# Patient Record
Sex: Male | Born: 1950 | Race: White | Hispanic: No | Marital: Married | Smoking: Never smoker
Health system: Southern US, Community
[De-identification: ages and names within clinical notes are randomized; demographics above are authoritative.]

## PROBLEM LIST (undated history)

## (undated) DIAGNOSIS — I10 Essential (primary) hypertension: Secondary | ICD-10-CM

## (undated) DIAGNOSIS — G4733 Obstructive sleep apnea (adult) (pediatric): Secondary | ICD-10-CM

## (undated) DIAGNOSIS — I2510 Atherosclerotic heart disease of native coronary artery without angina pectoris: Secondary | ICD-10-CM

## (undated) DIAGNOSIS — E785 Hyperlipidemia, unspecified: Secondary | ICD-10-CM

## (undated) DIAGNOSIS — M179 Osteoarthritis of knee, unspecified: Secondary | ICD-10-CM

## (undated) DIAGNOSIS — M1990 Unspecified osteoarthritis, unspecified site: Secondary | ICD-10-CM

## (undated) DIAGNOSIS — M1710 Unilateral primary osteoarthritis, unspecified knee: Secondary | ICD-10-CM

## (undated) HISTORY — DX: Essential (primary) hypertension: I10

## (undated) HISTORY — PX: BACK SURGERY: SHX140

## (undated) HISTORY — PX: HEMORRHOID SURGERY: SHX153

## (undated) HISTORY — DX: Unspecified osteoarthritis, unspecified site: M19.90

## (undated) HISTORY — DX: Obstructive sleep apnea (adult) (pediatric): G47.33

## (undated) HISTORY — DX: Atherosclerotic heart disease of native coronary artery without angina pectoris: I25.10

## (undated) HISTORY — DX: Hyperlipidemia, unspecified: E78.5

---

## 2006-09-15 ENCOUNTER — Emergency Department (HOSPITAL_COMMUNITY): Admission: EM | Admit: 2006-09-15 | Discharge: 2006-09-15 | Payer: Self-pay | Admitting: Emergency Medicine

## 2011-01-16 ENCOUNTER — Other Ambulatory Visit (HOSPITAL_COMMUNITY): Payer: Self-pay | Admitting: Chiropractic Medicine

## 2011-01-16 DIAGNOSIS — R52 Pain, unspecified: Secondary | ICD-10-CM

## 2011-01-20 ENCOUNTER — Ambulatory Visit (HOSPITAL_COMMUNITY)
Admission: RE | Admit: 2011-01-20 | Discharge: 2011-01-20 | Disposition: A | Discharge status: Home / Self Care | Payer: 59 | Source: Ambulatory Visit | Attending: Chiropractic Medicine | Admitting: Chiropractic Medicine

## 2011-01-20 DIAGNOSIS — IMO0002 Reserved for concepts with insufficient information to code with codable children: Secondary | ICD-10-CM | POA: Insufficient documentation

## 2011-01-20 DIAGNOSIS — R52 Pain, unspecified: Secondary | ICD-10-CM

## 2011-01-20 DIAGNOSIS — M719 Bursopathy, unspecified: Secondary | ICD-10-CM | POA: Insufficient documentation

## 2011-01-20 DIAGNOSIS — M67919 Unspecified disorder of synovium and tendon, unspecified shoulder: Secondary | ICD-10-CM | POA: Insufficient documentation

## 2011-01-20 DIAGNOSIS — M75100 Unspecified rotator cuff tear or rupture of unspecified shoulder, not specified as traumatic: Secondary | ICD-10-CM | POA: Insufficient documentation

## 2011-01-20 DIAGNOSIS — M25519 Pain in unspecified shoulder: Secondary | ICD-10-CM | POA: Insufficient documentation

## 2011-02-13 ENCOUNTER — Encounter: Payer: Self-pay | Admitting: Orthopedic Surgery

## 2011-02-13 ENCOUNTER — Ambulatory Visit (INDEPENDENT_AMBULATORY_CARE_PROVIDER_SITE_OTHER): Payer: 59 | Admitting: Orthopedic Surgery

## 2011-02-13 VITALS — BP 128/80 | Ht 74.0 in | Wt 226.0 lb

## 2011-02-13 DIAGNOSIS — M75101 Unspecified rotator cuff tear or rupture of right shoulder, not specified as traumatic: Secondary | ICD-10-CM

## 2011-02-13 DIAGNOSIS — M719 Bursopathy, unspecified: Secondary | ICD-10-CM

## 2011-02-13 MED ORDER — HYDROCODONE-ACETAMINOPHEN 5-325 MG PO TABS: 1.0000 | ORAL_TABLET | Freq: Four times a day (QID) | ORAL | Status: AC | PRN

## 2011-02-13 MED ORDER — NABUMETONE 500 MG PO TABS: 500.0000 mg | ORAL_TABLET | Freq: Two times a day (BID) | ORAL | Status: DC

## 2011-02-13 NOTE — Progress Notes (Signed)
Patient ID: Corey Bruce, male   DOB: [birth year], 60 y.o.   MRN: [id number] Referral from Dr. Ladona Ridgel  Chief complaint is pain RIGHT shoulder  The patient was followed by Dr. Ladona Ridgel for pain in his RIGHT shoulder and he was treated with several manipulations.  He complains of catching and severe pain with forward elevation which wakes him up at night.  However he has not been treated with physical therapy, injection or medication.  His pain started gradually 5 months ago his pain level is 10 out of 10 is worse at night and at rest he gets relief.  Review of systems negative for all systems  History as noted above and has been reviewed  Physical Exam(12) GENERAL: normal development   CDV: pulses are normal   Skin: normal  Lymph: nodes were not palpable/normal  Psychiatric: awake, alert and oriented  Neuro: normal sensation  MSK Cervical spine shows no restrictions in motion no increased muscle tone and no deformity.   1RIGHT shoulder forward elevation pain and 110 passive range of motion 150 2 Rotator cuff strength is normal 3 External rotation is normal 4 Shoulder is stable 5 No tenderness at the a.c. joint 6 Positive impingement sign  Assessment: Rotator cuff syndrome    Plan: Subacromial injection, physical therapy, nabumetone oral medication and Norco for pain.  If no improvement after 8 weeks reschedule for reevaluation.  MRI shows no cuff tear.  Tendinopathy age expected.  Bursitis seen him for signs of bursitis and also a cyst is seen behind the coracoid

## 2011-02-13 NOTE — Patient Instructions (Signed)
You have received a steroid shot. 15% of patients experience increased pain at the injection site with in the next 24 hours. This is best treated with ice and tylenol extra strength 2 tabs every 8 hours. If you are still having pain please call the office.  Start OT Therapy

## 2011-03-11 ENCOUNTER — Ambulatory Visit (HOSPITAL_COMMUNITY)
Admission: RE | Admit: 2011-03-11 | Discharge: 2011-03-11 | Disposition: A | Discharge status: Home / Self Care | Payer: 59 | Source: Ambulatory Visit | Attending: Orthopedic Surgery | Admitting: Orthopedic Surgery

## 2011-03-11 ENCOUNTER — Ambulatory Visit (HOSPITAL_COMMUNITY): Payer: 59 | Admitting: Specialist

## 2011-03-11 DIAGNOSIS — M7541 Impingement syndrome of right shoulder: Secondary | ICD-10-CM | POA: Insufficient documentation

## 2011-03-11 DIAGNOSIS — M6281 Muscle weakness (generalized): Secondary | ICD-10-CM | POA: Insufficient documentation

## 2011-03-11 DIAGNOSIS — M25619 Stiffness of unspecified shoulder, not elsewhere classified: Secondary | ICD-10-CM | POA: Insufficient documentation

## 2011-03-11 DIAGNOSIS — M25519 Pain in unspecified shoulder: Secondary | ICD-10-CM | POA: Insufficient documentation

## 2011-03-11 DIAGNOSIS — IMO0001 Reserved for inherently not codable concepts without codable children: Secondary | ICD-10-CM | POA: Insufficient documentation

## 2011-03-11 NOTE — Progress Notes (Signed)
Occupational Therapy Evaluation  Patient Details  Name: Corey Bruce MRN: [id number] Date of Birth: 01/14/1951  Today's Date: 03/11/2011 Time: 1355-1430 Time Calculation (min): 35 min OT Evaluation 1355-1415 20' Manual Therapy 1415-1430 15' Visit#: 1  of 8   Re-eval: 04/08/11  Assessment Diagnosis: Right Shoulder Impingement Syndrome Next MD Visit: unscheduled Prior Therapy: none  Past Medical History: No past medical history on file. Past Surgical History:  Past Surgical History  Procedure Date  . Hemorrhoid surgery   . Back surgery     Subjective Symptoms/Limitations Symptoms: S:  I have been having pain in my right shoulder on and off for about 7 months.  It hurts the worst at night.  My goal is to get rid of the pain. Limitations: Pertinent History:  Corey Bruce has been experiencing pain in his shoulder for 6-7 months.  He has been recieivng chiropractic care which has alleviated his pain somewhat.  He recieved a cortisone injection in his right shoulder in mid December.  He has not had any relief from his pain.  He has been referred to occupational therapy for evaluation and treatment. Pain Assessment Currently in Pain?: Yes Pain Score:   7 Pain Location: Shoulder Pain Orientation: Right Pain Type: Acute pain  Assessment Additional Assessments RUE AROM (degrees) RUE Overall AROM Comments: Assessed in seated.  ER and IR assessed with shoulder abducted Right Shoulder Flexion  0-170: 178 Degrees Right Shoulder ABduction 0-140: 145 Degrees Right Shoulder Internal Rotation  0-70: 75 Degrees Right Shoulder External Rotation  0-90: 85 Degrees RUE Strength Right Shoulder Flexion: 5/5 Right Shoulder ABduction: 5/5 Right Shoulder Internal Rotation: 5/5 Right Shoulder External Rotation: 5/5 Palpation Palpation: Minimal fascial restrictions noted in his right shoulder region.  Exercise/Treatments Supine Horizontal ABduction: PROM;10 reps External Rotation: PROM;10  reps Internal Rotation: PROM;10 reps Flexion: PROM;10 reps ABduction: PROM;10 reps    Manual Therapy Manual Therapy: Myofascial release Myofascial Release: MFR and manual massage and stretching to right shoulder region to decrease pain and increase mobility in pain free range.  Occupational Therapy Assessment and Plan OT Assessment and Plan Clinical Impression Statement: A:  Patient presents with intermittent increased pain and decreased scapular stability in his right shoulder, causing decreased comfort with overhead activities and when sleeping. Rehab Potential: Excellent OT Frequency: Min 2X/week OT Duration: 4 weeks OT Treatment/Interventions: Therapeutic exercise;Manual therapy;Modalities;Patient/family education OT Plan: P:  Skilled OT intervention to decrease pain and restrictions and increase mobility in his right shoulder.  Treatment Plan:  Manual massage and MFR to right scapular region.  Therapeutic Exercises:  Seated  shoulder AROM, thumb tacks, prot/ret//elev/dep, cybex row, w arms, x to v , tband for scapular stability, ball on wall, ube in reverse.  Progress to prone strengthening.  Add [place] if patient not having any relief from MFR after 2-3 sessions.   Goals Short Term Goals Time to Complete Short Term Goals: 2 weeks Short Term Goal 1: Patient will be educated on a HEP. Short Term Goal 2: Patient will increase AROM abduction and internal rotation by 10 degrees for increased ability to reach overhead. Short Term Goal 3: Patient will increase scapular stability from good to good+. Short Term Goal 4: Patient will decrease pain in his right shoulder from 9/10 to 5/10 when completing overhead activities. Short Term Goal 5: Patient will decrease fasical restrictions from minimal to trace in his right shoulder. Long Term Goals Time to Complete Long Term Goals: 4 weeks Long Term Goal 1: Patient will return to prior level  of I with all B/IADLs and leisure activities. Long Term Goal  2: Patient will increase AROM abduction and internal rotation to WNL for increased ability to complete activities overhead. Long Term Goal 3: Patient will increase scapular stability to normal. Long Term Goal 4: Patient will decrease fascial restrictions to 0 in his right shoulder region. Long Term Goal 5: Patient will decrease pain to 0/10 in his right shoulder when reaching overhead.  Problem List Patient Active Problem List  Diagnoses  . Impingement syndrome of right shoulder  . Pain in joint, shoulder region    End of Session Activity Tolerance: Patient tolerated treatment well General Behavior During Session: [facility] for tasks performed Cognition: [facility] for tasks performed OT Plan of Care OT Home Exercise Plan: Educated on shoulder stretches and theraband for scapular stability. Consulted and Agree with Plan of Care: Patient   Shirlean Mylar, OTR/L  03/11/2011, 3:47 PM  Physician Documentation Your signature is required to indicate approval of the treatment plan as stated above.  Please sign and either send electronically or make a copy of this report for your files and return this physician signed original.  Please mark one 1.__approve of plan  2. ___approve of plan with the following conditions.   ______________________________                                                          _____________________ Physician Signature                                                                                                             Date

## 2011-03-13 ENCOUNTER — Ambulatory Visit (HOSPITAL_COMMUNITY)
Admission: RE | Admit: 2011-03-13 | Discharge: 2011-03-13 | Disposition: A | Discharge status: Home / Self Care | Payer: 59 | Source: Ambulatory Visit | Attending: Internal Medicine | Admitting: Internal Medicine

## 2011-03-13 ENCOUNTER — Ambulatory Visit (HOSPITAL_COMMUNITY): Payer: 59 | Admitting: Specialist

## 2011-03-13 DIAGNOSIS — M25519 Pain in unspecified shoulder: Secondary | ICD-10-CM

## 2011-03-13 DIAGNOSIS — M7541 Impingement syndrome of right shoulder: Secondary | ICD-10-CM

## 2011-03-13 NOTE — Progress Notes (Signed)
Occupational Therapy Treatment  Patient Details  Name: Karas Pickerill MRN: [id number] Date of Birth: 1950-11-26  Today's Date: 03/13/2011 Time: 0347-4259 Manual Therapy [phone] 20' Therapeutic Exercise [phone] 18' Time Calculation (min): 38 min  Visit#: 2  of 8   Re-eval: 04/08/11    Subjective Symptoms/Limitations Symptoms: S: My shoulder is ore sore than yesterday. I started doing the exercises and it is more sore today. Repetition: Increases Symptoms Pain Assessment Currently in Pain?: Yes Pain Score:   6 Pain Location: Shoulder Pain Orientation: Right Pain Type: Acute pain Pain Onset: More than a month ago Pain Frequency: Intermittent  Precautions/Restrictions     Exercise/Treatments Supine Horizontal ABduction: PROM;10 reps External Rotation: PROM;10 reps Internal Rotation: PROM;10 reps Flexion: PROM;10 reps ABduction: PROM;10 reps Seated Elevation: AROM;15 reps;Theraband (red) Extension: Strengthening;15 reps;Theraband (red) Retraction: Strengthening;15 reps;Theraband (red) Row: Strengthening;15 reps;Theraband (red) Protraction: Strengthening;15 reps;Theraband (red) Horizontal ABduction: Strengthening;15 reps;Theraband (red) Theraband Level (Shoulder Horizontal ABduction):  (red) External Rotation: Strengthening;15 reps;Theraband (red) Internal Rotation: Strengthening;15 reps;Theraband (red) Flexion: Strengthening;15 reps;Theraband (red) Abduction: Strengthening;15 reps;Theraband (red) Prone    Sidelying   Standing   Pulleys   Therapy Ball   ROM / Strengthening / Radio producer Therapy Manual Therapy: Myofascial release Myofascial Release: MFR and manual stretch to Right shoulder region to decrease pain and increase mobility in pain free range.  Occupational Therapy Assessment and Plan OT Assessment and Plan Rehab Potential: Excellent OT Frequency: Min  2X/week OT Duration: 4 weeks OT Treatment/Interventions: Therapeutic exercise;Manual therapy;Modalities;Patient/family education OT Plan: P:  Skilled OT intervention to decrease pain and restrictions and increase mobility in his right shoulder.  Treatment Plan:  Manual massage and MFR to right scapular region.  Therapeutic Exercises:  Seated  shoulder AROM, thumb tacks, prot/ret//elev/dep, cybex row, w arms, x to v , tband for scapular stability, ball on wall, ube in reverse.  Progress to prone strengthening.  Add [place] if patient not having any relief from MFR after 2-3 sessions.   Goals    Problem List Patient Active Problem List  Diagnoses  . Impingement syndrome of right shoulder  . Pain in joint, shoulder region    End of Session Activity Tolerance: Patient tolerated treatment well General Behavior During Session: [facility] for tasks performed Cognition: [facility] for tasks performed OT Plan of Care OT Home Exercise Plan: Educated on shoulder stretches and theraband for scapular stability. Consulted and Agree with Plan of Care: Patient   Lisa Roca OTR/L 03/13/2011, 10:32 AM

## 2011-03-17 ENCOUNTER — Ambulatory Visit (HOSPITAL_COMMUNITY): Payer: 59 | Admitting: Specialist

## 2011-03-18 ENCOUNTER — Ambulatory Visit (HOSPITAL_COMMUNITY)
Admission: RE | Admit: 2011-03-18 | Discharge: 2011-03-18 | Disposition: A | Discharge status: Home / Self Care | Payer: 59 | Source: Ambulatory Visit | Attending: Internal Medicine | Admitting: Internal Medicine

## 2011-03-18 DIAGNOSIS — M7541 Impingement syndrome of right shoulder: Secondary | ICD-10-CM

## 2011-03-18 DIAGNOSIS — M25519 Pain in unspecified shoulder: Secondary | ICD-10-CM

## 2011-03-18 NOTE — Progress Notes (Signed)
Occupational Therapy Treatment  Patient Details  Name: Corey Bruce MRN: [id number] Date of Birth: 01-27-1951  Today's Date: 03/18/2011 Time: 4098-1191 Time Calculation (min): 37 min Manual therapy [phone] 9' Therapeutic Exercise [phone] 28' Visit#: 3  of 8   Re-eval: 04/08/11   Subjective Symptoms/Limitations Symptoms: S: My apin is a little better. Repetition: Decreases Symptoms Pain Assessment Currently in Pain?: Yes Pain Score:   2 Pain Location: Shoulder Pain Orientation: Right Pain Type: Acute pain Pain Onset: In the past 7 days Pain Frequency: Intermittent  Precautions/Restrictions     Exercise/Treatments Supine Horizontal ABduction: PROM;10 reps External Rotation: PROM;10 reps Internal Rotation: PROM;10 reps Flexion: PROM;10 reps ABduction: PROM;10 reps Seated Elevation: AROM;15 reps;Theraband Theraband Level (Shoulder Elevation): Level 2 (Red) Extension: Strengthening;Theraband;20 reps Theraband Level (Shoulder Extension): Level 2 (Red) Retraction: Strengthening;20 reps;Theraband Theraband Level (Shoulder Retraction): Level 2 (Red) Row: Strengthening;20 reps;Theraband Theraband Level (Shoulder Row): Level 2 (Red) Protraction: Strengthening;20 reps;Theraband Theraband Level (Shoulder Protraction): Level 2 (Red) Horizontal ABduction: Strengthening;20 reps;Theraband Theraband Level (Shoulder Horizontal ABduction): Level 2 (Red) External Rotation: Strengthening;20 reps;Theraband Theraband Level (Shoulder External Rotation): Level 2 (Red) Internal Rotation: Strengthening;20 reps;Theraband Theraband Level (Shoulder Internal Rotation): Level 2 (Red) Flexion: Strengthening;20 reps;Theraband (flexed elbow) Theraband Level (Shoulder Flexion): Level 2 (Red) Abduction: Strengthening;20 reps;Theraband (flexed elbow)    ROM / Strengthening / Isometric Strengthening UBE (Upper Arm Bike): 3'x3' level 2 Thumb Tacks:  (1 min) Wall Pushups: 15 reps (do not push  all the way up and keep pain free) Ball on Wall:  (1 min with elbow close to side.)      Manual Therapy Manual Therapy: Joint mobilization Joint Mobilization: Light joint mob. in si=upine for increase A/PROM   Occupational Therapy Assessment and Plan OT Assessment and Plan Clinical Impression Statement: A: Patient has decreased pain in shouldr today with increase functional mobility. Still unable to do exercises with arm abducted and flex away from body without pain. Rehab Potential: Excellent OT Frequency: Min 2X/week OT Duration: 4 weeks OT Treatment/Interventions: Therapeutic exercise;Manual therapy;Modalities;Patient/family education OT Plan: P: Continue POC Added strengthening 20 reps with tband, UBE in reveerse, balon wall, and pro/re/elev/ depr of scapula   Home Exercise Program PT Goal: Perform Home Exercise Program - Progress: Progressing toward goal Short Term Goals Short Term Goal 1 Progress: Met Short Term Goal 2 Progress: Progressing toward goal Short Term Goal 3 Progress: Progressing toward goal Short Term Goal 4 Progress: Progressing toward goal Short Term Goal 5 Progress: Met  Problem List Patient Active Problem List  Diagnoses  . Impingement syndrome of right shoulder  . Pain in joint, shoulder region    End of Session Activity Tolerance: Patient tolerated treatment well General Behavior During Session: [facility] for tasks performed Cognition: [facility] for tasks performed OT Plan of Care OT Home Exercise Plan: Educated on shoulder stretches and theraband for scapular stability. Consulted and Agree with Plan of Care: Patient   Lisa Roca OTR/L 03/18/2011, 8:57 AM

## 2011-03-19 ENCOUNTER — Ambulatory Visit (HOSPITAL_COMMUNITY): Payer: 59 | Admitting: Occupational Therapy

## 2011-03-20 ENCOUNTER — Ambulatory Visit (HOSPITAL_COMMUNITY)
Admission: RE | Admit: 2011-03-20 | Discharge: 2011-03-20 | Disposition: A | Discharge status: Home / Self Care | Payer: 59 | Source: Ambulatory Visit | Attending: Internal Medicine | Admitting: Internal Medicine

## 2011-03-20 DIAGNOSIS — M7541 Impingement syndrome of right shoulder: Secondary | ICD-10-CM

## 2011-03-20 DIAGNOSIS — M25519 Pain in unspecified shoulder: Secondary | ICD-10-CM

## 2011-03-20 NOTE — Progress Notes (Signed)
Occupational Therapy Treatment  Patient Details  Name: Corey Bruce MRN: [id number] Date of Birth: 1950/07/16  Today's Date: 03/20/2011 Time: 4098-1191 Manual Therapy [phone] 30' Therapeutic Exercise [phone] 10'  Time Calculation (min): 40 min  Visit#: 4  of 8   Re-eval: 04/08/11    Subjective Symptoms/Limitations Symptoms: S: "I think I may have messed up. I tried to help my buddy move something and now it hurts more." Repetition: Increases Symptoms Pain Assessment Currently in Pain?: Yes Pain Score:   5 Pain Location: Shoulder Pain Orientation: Right Pain Type: Acute pain Pain Onset: In the past 7 days Pain Frequency: Intermittent Multiple Pain Sites: No  Precautions/Restrictions     Exercise/Treatments Supine Horizontal ABduction: PROM;10 reps External Rotation: PROM;10 reps Internal Rotation: PROM;10 reps Flexion: PROM;10 reps ABduction: PROM;10 reps Seated Elevation: AROM;15 reps Extension: AROM;15 reps Retraction: AROM;15 reps Row: AROM;15 reps   Therapy Ball Flexion: 20 reps ABduction: 20 reps Right/Left: 5 reps ROM / Strengthening / Isometric Strengthening UBE (Upper Arm Bike):  (2 min backward level 1)     Manual Therapy Manual Therapy: Myofascial release Myofascial Release: MFR and manual stretch to decrease pain and increase mobility in pain free range  Occupational Therapy Assessment and Plan OT Assessment and Plan Clinical Impression Statement: A: Program modifed due to patient coming in with increased pain . MFR relieved pain to 0/10 and pateint able to do the AAROM and light use of shoulder without pain. Rehab Potential: Excellent OT Frequency: Min 2X/week OT Duration: 4 weeks OT Treatment/Interventions: Self-care/ADL training;Therapeutic exercise;DME and/or AE instruction;Manual therapy;Modalities;Therapeutic activities;Patient/family education OT Plan: P: Cintinue POC able to do reverse UBE and scapula ex. but unable to do tband .    Goals Home Exercise Program PT Goal: Perform Home Exercise Program - Progress: Progressing toward goal Short Term Goals Short Term Goal 1 Progress: Met Short Term Goal 2 Progress: Met Short Term Goal 3 Progress: Progressing toward goal Short Term Goal 4 Progress: Met Short Term Goal 5 Progress: Met Long Term Goals Long Term Goal 1 Progress: Progressing toward goal Long Term Goal 2 Progress: Progressing toward goal Long Term Goal 3 Progress: Progressing toward goal Long Term Goal 4 Progress: Progressing toward goal Long Term Goal 5 Progress: Progressing toward goal  Problem List Patient Active Problem List  Diagnoses  . Impingement syndrome of right shoulder  . Pain in joint, shoulder region    End of Session Activity Tolerance: Patient tolerated treatment well General Behavior During Session: [facility] for tasks performed Cognition: [facility] for tasks performed OT Plan of Care OT Home Exercise Plan: Educated patient on need to not be lifting with the arm right now greater tahn about 5 lbs. and to obey pain. Consulted and Agree with Plan of Care: Patient   Lisa Roca OTR/L 03/20/2011, 11:22 AM

## 2011-03-21 ENCOUNTER — Ambulatory Visit (HOSPITAL_COMMUNITY): Payer: 59 | Admitting: Occupational Therapy

## 2011-03-24 ENCOUNTER — Ambulatory Visit (HOSPITAL_COMMUNITY): Payer: 59 | Admitting: Occupational Therapy

## 2011-03-25 ENCOUNTER — Ambulatory Visit (HOSPITAL_COMMUNITY)
Admission: RE | Admit: 2011-03-25 | Discharge: 2011-03-25 | Disposition: A | Discharge status: Home / Self Care | Payer: 59 | Source: Ambulatory Visit | Attending: Internal Medicine | Admitting: Internal Medicine

## 2011-03-25 DIAGNOSIS — M25519 Pain in unspecified shoulder: Secondary | ICD-10-CM

## 2011-03-25 DIAGNOSIS — M7541 Impingement syndrome of right shoulder: Secondary | ICD-10-CM

## 2011-03-25 NOTE — Progress Notes (Signed)
Occupational Therapy Treatment  Patient Details  Name: Corey Bruce MRN: [id number] Date of Birth: Jul 12, 1950  Today's Date: 03/25/2011 Time: 7829-5621 Time Calculation (min): 40 min Manual Therapy [phone] 10' Therapeutic Exercise [phone] 30' Visit#: 5  of 8   Re-eval: 04/08/11    Subjective Symptoms/Limitations Symptoms: S:" I needed to put shelving together. I can't stop doing stuff." Patient needs to be active and productive Repetition: Decreases Symptoms Pain Assessment Currently in Pain?: Yes Pain Score:   4 Pain Location: Shoulder Pain Orientation: Left Pain Type: Acute pain Pain Onset: In the past 7 days Pain Frequency: Intermittent  Precautions/Restrictions     Exercise/Treatments Supine Horizontal ABduction: PROM;10 reps External Rotation: PROM;10 reps Internal Rotation: PROM;10 reps Flexion: PROM;10 reps ABduction: PROM;10 reps Seated Elevation: AROM;15 reps Extension: Strengthening;10 reps;Theraband Theraband Level (Shoulder Extension): Level 4 (Blue) Retraction: Strengthening;10 reps;Theraband Theraband Level (Shoulder Retraction): Level 4 (Blue) Row: Strengthening;10 reps;Theraband Theraband Level (Shoulder Row): Level 3 (Green) Protraction: Strengthening;10 reps;Theraband Theraband Level (Shoulder Protraction): Level 4 (Blue) Horizontal ABduction: Strengthening;10 reps;Theraband Theraband Level (Shoulder Horizontal ABduction): Level 4 (Blue) External Rotation: Strengthening;20 reps;Theraband Theraband Level (Shoulder External Rotation): Level 4 (Blue) Internal Rotation: Strengthening;20 reps;Theraband Theraband Level (Shoulder Internal Rotation): Level 4 (Blue) Flexion: Strengthening;20 reps;Theraband Theraband Level (Shoulder Flexion): Level 4 (Blue) Abduction: Strengthening;20 reps;Theraband Theraband Level (Shoulder ABduction): Level 4 (Blue) Therapy Ball Flexion: 20 reps ABduction: 20 reps Right/Left: 5 reps ROM / Strengthening /  Isometric Strengthening UBE (Upper Arm Bike):  (3'and 3 level 3)      Manual Therapy Joint Mobilization: Light PROM to shoulder in supine with good result and no pain.  Occupational Therapy Assessment and Plan     Goals Short Term Goals Time to Complete Short Term Goals: 2 weeks Short Term Goal 1: Patient will be educated on a HEP. Short Term Goal 1 Progress: Met Short Term Goal 2: Patient will increase AROM abduction and internal rotation by 10 degrees for increased ability to reach overhead. Short Term Goal 2 Progress: Met Short Term Goal 3: Patient will increase scapular stability from good to good+. Short Term Goal 3 Progress: Met Short Term Goal 4: Patient will decrease pain in his right shoulder from 9/10 to 5/10 when completing overhead activities. Short Term Goal 4 Progress: Met Short Term Goal 5: Patient will decrease fasical restrictions from minimal to trace in his right shoulder. Short Term Goal 5 Progress: Met Long Term Goals Time to Complete Long Term Goals: 4 weeks Long Term Goal 1: Patient will return to prior level of I with all B/IADLs and leisure activities. Long Term Goal 1 Progress: Progressing toward goal Long Term Goal 2: Patient will increase AROM abduction and internal rotation to WNL for increased ability to complete activities overhead. Long Term Goal 2 Progress: Progressing toward goal Long Term Goal 3: Patient will increase scapular stability to normal. Long Term Goal 3 Progress: Progressing toward goal Long Term Goal 4: Patient will decrease fascial restrictions to 0 in his right shoulder region. Long Term Goal 4 Progress: Progressing toward goal Long Term Goal 5: Patient will decrease pain to 0/10 in his right shoulder when reaching overhead. Long Term Goal 5 Progress: Progressing toward goal  Problem List Patient Active Problem List  Diagnoses  . Impingement syndrome of right shoulder  . Pain in joint, shoulder region        Lisa Roca OTR/L 03/25/2011, 8:40 AM

## 2011-03-26 ENCOUNTER — Ambulatory Visit (HOSPITAL_COMMUNITY): Payer: 59 | Admitting: Occupational Therapy

## 2011-03-27 ENCOUNTER — Ambulatory Visit (HOSPITAL_COMMUNITY): Payer: 59 | Admitting: Occupational Therapy

## 2011-03-28 ENCOUNTER — Ambulatory Visit (HOSPITAL_COMMUNITY): Payer: 59 | Admitting: Occupational Therapy

## 2011-03-31 ENCOUNTER — Ambulatory Visit (HOSPITAL_COMMUNITY): Payer: 59 | Admitting: Specialist

## 2011-03-31 ENCOUNTER — Ambulatory Visit (HOSPITAL_COMMUNITY): Payer: 59 | Admitting: Occupational Therapy

## 2011-04-01 ENCOUNTER — Ambulatory Visit (HOSPITAL_COMMUNITY): Payer: 59 | Admitting: Occupational Therapy

## 2011-04-01 ENCOUNTER — Telehealth (HOSPITAL_COMMUNITY): Payer: Self-pay

## 2011-04-02 ENCOUNTER — Ambulatory Visit (HOSPITAL_COMMUNITY): Payer: 59 | Admitting: Occupational Therapy

## 2011-04-02 ENCOUNTER — Ambulatory Visit (HOSPITAL_COMMUNITY): Payer: 59 | Admitting: Specialist

## 2011-04-03 ENCOUNTER — Ambulatory Visit (HOSPITAL_COMMUNITY): Payer: 59 | Admitting: Occupational Therapy

## 2011-04-04 ENCOUNTER — Ambulatory Visit (HOSPITAL_COMMUNITY): Payer: 59 | Admitting: Occupational Therapy

## 2011-04-07 ENCOUNTER — Ambulatory Visit (HOSPITAL_COMMUNITY): Payer: 59 | Admitting: Occupational Therapy

## 2011-04-09 ENCOUNTER — Ambulatory Visit (HOSPITAL_COMMUNITY): Payer: 59 | Admitting: Occupational Therapy

## 2011-04-11 ENCOUNTER — Ambulatory Visit (HOSPITAL_COMMUNITY): Payer: 59 | Admitting: Occupational Therapy

## 2012-01-19 ENCOUNTER — Telehealth: Payer: Self-pay

## 2012-01-19 NOTE — Telephone Encounter (Signed)
Pt is scheduled for OV with Tana Coast, PA on 01/21/2012 @ 1:30 AM. For hemorrhoid bleed prior to TCS.

## 2012-01-21 ENCOUNTER — Other Ambulatory Visit: Payer: Self-pay | Admitting: Internal Medicine

## 2012-01-21 ENCOUNTER — Ambulatory Visit (INDEPENDENT_AMBULATORY_CARE_PROVIDER_SITE_OTHER): Payer: 59 | Admitting: Gastroenterology

## 2012-01-21 ENCOUNTER — Encounter: Payer: Self-pay | Admitting: Gastroenterology

## 2012-01-21 VITALS — BP 127/84 | HR 83 | Temp 98.6°F | Ht 73.0 in | Wt 218.2 lb

## 2012-01-21 DIAGNOSIS — K625 Hemorrhage of anus and rectum: Secondary | ICD-10-CM

## 2012-01-21 DIAGNOSIS — Z8719 Personal history of other diseases of the digestive system: Secondary | ICD-10-CM

## 2012-01-21 DIAGNOSIS — Z87898 Personal history of other specified conditions: Secondary | ICD-10-CM

## 2012-01-21 DIAGNOSIS — Z1211 Encounter for screening for malignant neoplasm of colon: Secondary | ICD-10-CM | POA: Insufficient documentation

## 2012-01-21 NOTE — Progress Notes (Signed)
Faxed to PCP

## 2012-01-21 NOTE — Assessment & Plan Note (Signed)
Patient presents to schedule colonoscopy. He has h/o hemorrhoids and rectal bleeding. No recent rectal bleeding however. Colonoscopy in near future.  I have discussed the risks, alternatives, benefits with regards to but not limited to the risk of reaction to medication, bleeding, infection, perforation and the patient is agreeable to proceed. Written consent to be obtained.

## 2012-01-21 NOTE — Progress Notes (Signed)
Primary Care Physician:  HALL,ZACK, MD  Primary Gastroenterologist:  Michael Rourk, MD   Chief Complaint  Patient presents with  . Hemorrhoids    HPI:  Corey Bruce is a 61 y.o. male here to schedule colonoscopy. He has h/o rectal bleeding which last for about one week with heavy lifting, prolonged standing. Symptoms resolved. He thought it might be due to hemorrhoids. He had hemorrhoidectomy 25 years ago. Last colonoscopy about 11 years ago, no polyps. No FH of colon cancer. No constipation, diarrhea. No weight loss. No heartburn, vomiting, dysphagia. Remote etoh abuse, but none in 26 years.    Current Outpatient Prescriptions  Medication Sig Dispense Refill  . Ascorbic Acid (VITAMIN C) 1000 MG tablet Take 1,000 mg by mouth daily.      . glucosamine-chondroitin 500-400 MG tablet Take 1 tablet by mouth 1 day or 1 dose.      . Multiple Vitamin (MULTIVITAMIN) tablet Take 1 tablet by mouth daily.      . olmesartan (BENICAR) 20 MG tablet Take 20 mg by mouth daily.        Allergies as of 01/21/2012 - Review Complete 01/21/2012  Allergen Reaction Noted  . Erythromycin Nausea Only 01/21/2012    Past Medical History  Diagnosis Date  . HTN (hypertension)     Past Surgical History  Procedure Date  . Hemorrhoid surgery   . Back surgery     lower    Family History  Problem Relation Age of Onset  . Diabetes    . Colon cancer Neg Hx     History   Social History  . Marital Status: Married    Spouse Name: N/A    Number of Children: 5  . Years of Education: college   Occupational History  . retired   .     Social History Main Topics  . Smoking status: Never Smoker   . Smokeless tobacco: Not on file     Comment: Smoked only about a year as teenager  . Alcohol Use: No     Comment: remote etoh abuse over 26 years ago  . Drug Use: No  . Sexually Active: Not on file   Other Topics Concern  . Not on file   Social History Narrative  . No narrative on file       ROS:  General: Negative for anorexia, weight loss, fever, chills, fatigue, weakness. Eyes: Negative for vision changes.  ENT: Negative for hoarseness, difficulty swallowing , nasal congestion. CV: Negative for chest pain, angina, palpitations, dyspnea on exertion, peripheral edema.  Respiratory: Negative for dyspnea at rest, dyspnea on exertion, cough, sputum, wheezing.  GI: See history of present illness. GU:  Negative for dysuria, hematuria, urinary incontinence, urinary frequency, nocturnal urination.  MS: Negative for joint pain, low back pain.  Derm: Negative for rash or itching.  Neuro: Negative for weakness, abnormal sensation, seizure, frequent headaches, memory loss, confusion.  Psych: Negative for anxiety, depression, suicidal ideation, hallucinations.  Endo: Negative for unusual weight change.  Heme: Negative for bruising or bleeding. Allergy: Negative for rash or hives.    Physical Examination:  BP 127/84  Pulse 83  Temp 98.6 F (37 C) (Tympanic)  Ht 6' 1" (1.854 m)  Wt 218 lb 3.2 oz (98.975 kg)  BMI 28.79 kg/m2   General: Well-nourished, well-developed in no acute distress.  Head: Normocephalic, atraumatic.   Eyes: Conjunctiva pink, no icterus. Mouth: Oropharyngeal mucosa moist and pink , no lesions erythema or exudate. Neck: Supple without   thyromegaly, masses, or lymphadenopathy.  Lungs: Clear to auscultation bilaterally.  Heart: Regular rate and rhythm, no murmurs rubs or gallops.  Abdomen: Bowel sounds are normal, nontender, nondistended, no hepatosplenomegaly or masses, no abdominal bruits or hernia , no rebound or guarding.   Rectal: deferred Extremities: No lower extremity edema. No clubbing or deformities.  Neuro: Alert and oriented x 4 , grossly normal neurologically.  Skin: Warm and dry, no rash or jaundice.   Psych: Alert and cooperative, normal mood and affect.   

## 2012-01-21 NOTE — Patient Instructions (Signed)
We have scheduled you for a colonoscopy with Dr. Rourk. Please see separate instructions. 

## 2012-01-28 ENCOUNTER — Encounter (HOSPITAL_COMMUNITY): Payer: Self-pay | Admitting: Pharmacy Technician

## 2012-02-09 ENCOUNTER — Ambulatory Visit (HOSPITAL_COMMUNITY)
Admission: RE | Admit: 2012-02-09 | Discharge: 2012-02-09 | Disposition: A | Discharge status: Home / Self Care | Payer: 59 | Source: Ambulatory Visit | Attending: Internal Medicine | Admitting: Internal Medicine

## 2012-02-09 ENCOUNTER — Encounter (HOSPITAL_COMMUNITY)
Admission: RE | Disposition: A | Discharge status: Home / Self Care | Payer: Self-pay | Source: Ambulatory Visit | Attending: Internal Medicine

## 2012-02-09 ENCOUNTER — Encounter (HOSPITAL_COMMUNITY): Payer: Self-pay

## 2012-02-09 DIAGNOSIS — K625 Hemorrhage of anus and rectum: Secondary | ICD-10-CM

## 2012-02-09 DIAGNOSIS — K921 Melena: Secondary | ICD-10-CM | POA: Insufficient documentation

## 2012-02-09 DIAGNOSIS — K644 Residual hemorrhoidal skin tags: Secondary | ICD-10-CM | POA: Insufficient documentation

## 2012-02-09 DIAGNOSIS — I10 Essential (primary) hypertension: Secondary | ICD-10-CM | POA: Insufficient documentation

## 2012-02-09 DIAGNOSIS — D128 Benign neoplasm of rectum: Secondary | ICD-10-CM | POA: Insufficient documentation

## 2012-02-09 DIAGNOSIS — K621 Rectal polyp: Secondary | ICD-10-CM

## 2012-02-09 DIAGNOSIS — K620 Anal polyp: Secondary | ICD-10-CM

## 2012-02-09 DIAGNOSIS — D129 Benign neoplasm of anus and anal canal: Secondary | ICD-10-CM | POA: Insufficient documentation

## 2012-02-09 DIAGNOSIS — Z8719 Personal history of other diseases of the digestive system: Secondary | ICD-10-CM

## 2012-02-09 HISTORY — PX: COLONOSCOPY: SHX5424

## 2012-02-09 SURGERY — COLONOSCOPY
Anesthesia: Moderate Sedation

## 2012-02-09 MED ORDER — SODIUM CHLORIDE 0.45 % IV SOLN
INTRAVENOUS | Status: DC
  Administered 2012-02-09: 13:00:00 via INTRAVENOUS

## 2012-02-09 MED ORDER — STERILE WATER FOR IRRIGATION IR SOLN
Status: DC | PRN
  Administered 2012-02-09: 13:00:00

## 2012-02-09 MED ORDER — MIDAZOLAM HCL 5 MG/5ML IJ SOLN
INTRAMUSCULAR | Status: AC
  Filled 2012-02-09: qty 10

## 2012-02-09 MED ORDER — MEPERIDINE HCL 100 MG/ML IJ SOLN
INTRAMUSCULAR | Status: AC
  Filled 2012-02-09: qty 1

## 2012-02-09 MED ORDER — MEPERIDINE HCL 100 MG/ML IJ SOLN
INTRAMUSCULAR | Status: DC | PRN
  Administered 2012-02-09: 25 mg via INTRAVENOUS
  Administered 2012-02-09: 50 mg via INTRAVENOUS

## 2012-02-09 MED ORDER — MIDAZOLAM HCL 5 MG/5ML IJ SOLN
INTRAMUSCULAR | Status: DC | PRN
  Administered 2012-02-09 (×2): 2 mg via INTRAVENOUS

## 2012-02-09 NOTE — H&P (View-Only) (Signed)
Primary Care Physician:  Dwana Melena, MD  Primary Gastroenterologist:  Roetta Sessions, MD   Chief Complaint  Patient presents with  . Hemorrhoids    HPI:  Corey Bruce is a 61 y.o. male here to schedule colonoscopy. He has h/o rectal bleeding which last for about one week with heavy lifting, prolonged standing. Symptoms resolved. He thought it might be due to hemorrhoids. He had hemorrhoidectomy 25 years ago. Last colonoscopy about 11 years ago, no polyps. No FH of colon cancer. No constipation, diarrhea. No weight loss. No heartburn, vomiting, dysphagia. Remote etoh abuse, but none in 26 years.    Current Outpatient Prescriptions  Medication Sig Dispense Refill  . Ascorbic Acid (VITAMIN C) 1000 MG tablet Take 1,000 mg by mouth daily.      Marland Kitchen glucosamine-chondroitin 500-400 MG tablet Take 1 tablet by mouth 1 day or 1 dose.      . Multiple Vitamin (MULTIVITAMIN) tablet Take 1 tablet by mouth daily.      Marland Kitchen olmesartan (BENICAR) 20 MG tablet Take 20 mg by mouth daily.        Allergies as of 01/21/2012 - Review Complete 01/21/2012  Allergen Reaction Noted  . Erythromycin Nausea Only 01/21/2012    Past Medical History  Diagnosis Date  . HTN (hypertension)     Past Surgical History  Procedure Date  . Hemorrhoid surgery   . Back surgery     lower    Family History  Problem Relation Age of Onset  . Diabetes    . Colon cancer Neg Hx     History   Social History  . Marital Status: Married    Spouse Name: N/A    Number of Children: 5  . Years of Education: college   Occupational History  . retired   .     Social History Main Topics  . Smoking status: Never Smoker   . Smokeless tobacco: Not on file     Comment: Smoked only about a year as teenager  . Alcohol Use: No     Comment: remote etoh abuse over 26 years ago  . Drug Use: No  . Sexually Active: Not on file   Other Topics Concern  . Not on file   Social History Narrative  . No narrative on file       ROS:  General: Negative for anorexia, weight loss, fever, chills, fatigue, weakness. Eyes: Negative for vision changes.  ENT: Negative for hoarseness, difficulty swallowing , nasal congestion. CV: Negative for chest pain, angina, palpitations, dyspnea on exertion, peripheral edema.  Respiratory: Negative for dyspnea at rest, dyspnea on exertion, cough, sputum, wheezing.  GI: See history of present illness. GU:  Negative for dysuria, hematuria, urinary incontinence, urinary frequency, nocturnal urination.  MS: Negative for joint pain, low back pain.  Derm: Negative for rash or itching.  Neuro: Negative for weakness, abnormal sensation, seizure, frequent headaches, memory loss, confusion.  Psych: Negative for anxiety, depression, suicidal ideation, hallucinations.  Endo: Negative for unusual weight change.  Heme: Negative for bruising or bleeding. Allergy: Negative for rash or hives.    Physical Examination:  BP 127/84  Pulse 83  Temp 98.6 F (37 C) (Tympanic)  Ht 6\' 1"  (1.854 m)  Wt 218 lb 3.2 oz (98.975 kg)  BMI 28.79 kg/m2   General: Well-nourished, well-developed in no acute distress.  Head: Normocephalic, atraumatic.   Eyes: Conjunctiva pink, no icterus. Mouth: Oropharyngeal mucosa moist and pink , no lesions erythema or exudate. Neck: Supple without  thyromegaly, masses, or lymphadenopathy.  Lungs: Clear to auscultation bilaterally.  Heart: Regular rate and rhythm, no murmurs rubs or gallops.  Abdomen: Bowel sounds are normal, nontender, nondistended, no hepatosplenomegaly or masses, no abdominal bruits or hernia , no rebound or guarding.   Rectal: deferred Extremities: No lower extremity edema. No clubbing or deformities.  Neuro: Alert and oriented x 4 , grossly normal neurologically.  Skin: Warm and dry, no rash or jaundice.   Psych: Alert and cooperative, normal mood and affect.

## 2012-02-09 NOTE — Interval H&P Note (Signed)
History and Physical Interval Note:  02/09/2012 12:53 PM  Corey Bruce  has presented today for surgery, with the diagnosis of Rectal Bleed, hx of hemorrhoids  The various methods of treatment have been discussed with the patient and family. After consideration of risks, benefits and other options for treatment, the patient has consented to  Procedure(s) (LRB) with comments: COLONOSCOPY (N/A) - 9:30 had to reschd due to ercp case called patient advised of new time 12:30 for 1:30 case patient stated okay/kr  as a surgical intervention .  The patient's history has been reviewed, patient examined, no change in status, stable for surgery.  I have reviewed the patient's chart and labs.  Questions were answered to the patient's satisfaction.     Eula Listen  As above. Colonoscopy per plan.The risks, benefits, limitations, alternatives and imponderables have been reviewed with the patient. Questions have been answered. All parties are agreeable.

## 2012-02-09 NOTE — Op Note (Signed)
[facility] [address] [place], [address]   COLONOSCOPY PROCEDURE REPORT  PATIENT: Corey Bruce, Corey Bruce  MR#:         [id number] BIRTHDATE: 06/28/1950 , 61  yrs. old GENDER: Male ENDOSCOPIST: R.  Roetta Sessions, MD FACP FACG REFERRED BY:  Catalina Pizza, M.D. PROCEDURE DATE:  02/09/2012 PROCEDURE:     Colonoscopy with biopsy  INDICATIONS: recent self limiting episode of hematochezia; last colonoscopy over 10 years ago  INFORMED CONSENT:  The risks, benefits, alternatives and imponderables including but not limited to bleeding, perforation as well as the possibility of a missed lesion have been reviewed.  The potential for biopsy, lesion removal, etc. have also been discussed.  Questions have been answered.  All parties agreeable. Please see the history and physical in the medical record for more information.  MEDICATIONS: Versed 4 mg IV and Demerol 75 mg IV in divided doses.  DESCRIPTION OF PROCEDURE:  After a digital rectal exam was performed, the EC-[id number] ([id number])  colonoscope was advanced from the anus through the rectum and colon to the area of the cecum, ileocecal valve and appendiceal orifice.  The cecum was deeply intubated.  These structures were well-seen and photographed for the record.  From the level of the cecum and ileocecal valve, the scope was slowly and cautiously withdrawn.  The mucosal surfaces were carefully surveyed utilizing scope tip deflection to facilitate fold flattening as needed.  The scope was pulled down into the rectum where a thorough examination including retroflexion was performed.    FINDINGS:  Adequate preparation. 2 external hemorrhoid tags and one anal papilla; there were varices 1) diminutive polyp just inside the anal verge a 2 cm; otherwise, the remainder of rectal mucosa appeared normal. Normal-appearing colonic mucosa.  THERAPEUTIC / DIAGNOSTIC MANEUVERS PERFORMED:  The above-mentioned polyp was cold  biopsied/removed.  COMPLICATIONS: None  CECAL WITHDRAWAL TIME:  10 minutes  IMPRESSION:  External hemorrhoids-likely source of hematochezia/anal papilla. Single rectal polyp-removed as described above. Normal colon.  RECOMMENDATIONS: Followup on pathology. Course of Anusol suppositories.   _______________________________ eSigned:  R. Roetta Sessions, MD FACP [facility] 02/09/2012 1:30 PM   CC:

## 2012-02-11 ENCOUNTER — Encounter (HOSPITAL_COMMUNITY): Payer: Self-pay | Admitting: Internal Medicine

## 2012-02-12 ENCOUNTER — Encounter: Payer: Self-pay | Admitting: Internal Medicine

## 2012-02-12 ENCOUNTER — Encounter: Payer: Self-pay | Admitting: *Deleted

## 2015-01-17 ENCOUNTER — Encounter: Payer: Self-pay | Admitting: Internal Medicine

## 2015-10-05 DIAGNOSIS — K2900 Acute gastritis without bleeding: Secondary | ICD-10-CM | POA: Diagnosis not present

## 2015-10-05 DIAGNOSIS — R42 Dizziness and giddiness: Secondary | ICD-10-CM | POA: Diagnosis not present

## 2015-10-05 DIAGNOSIS — K2970 Gastritis, unspecified, without bleeding: Secondary | ICD-10-CM | POA: Diagnosis not present

## 2015-10-10 DIAGNOSIS — R110 Nausea: Secondary | ICD-10-CM | POA: Diagnosis not present

## 2015-10-10 DIAGNOSIS — R030 Elevated blood-pressure reading, without diagnosis of hypertension: Secondary | ICD-10-CM | POA: Diagnosis not present

## 2015-10-10 DIAGNOSIS — R42 Dizziness and giddiness: Secondary | ICD-10-CM | POA: Diagnosis not present

## 2015-11-08 ENCOUNTER — Other Ambulatory Visit: Payer: Self-pay | Admitting: Orthopedic Surgery

## 2015-11-08 DIAGNOSIS — M1712 Unilateral primary osteoarthritis, left knee: Secondary | ICD-10-CM | POA: Diagnosis not present

## 2015-11-08 DIAGNOSIS — M25561 Pain in right knee: Secondary | ICD-10-CM

## 2015-11-16 ENCOUNTER — Other Ambulatory Visit: Payer: Self-pay | Admitting: Orthopedic Surgery

## 2015-11-16 DIAGNOSIS — M25562 Pain in left knee: Secondary | ICD-10-CM

## 2015-11-19 ENCOUNTER — Ambulatory Visit
Admission: RE | Admit: 2015-11-19 | Discharge: 2015-11-19 | Disposition: A | Discharge status: Home / Self Care | Payer: Medicare Other | Source: Ambulatory Visit | Attending: Orthopedic Surgery | Admitting: Orthopedic Surgery

## 2015-11-19 DIAGNOSIS — M25562 Pain in left knee: Secondary | ICD-10-CM

## 2015-11-20 DIAGNOSIS — S46011A Strain of muscle(s) and tendon(s) of the rotator cuff of right shoulder, initial encounter: Secondary | ICD-10-CM | POA: Diagnosis not present

## 2015-11-20 DIAGNOSIS — M1712 Unilateral primary osteoarthritis, left knee: Secondary | ICD-10-CM | POA: Diagnosis not present

## 2015-11-23 ENCOUNTER — Encounter (HOSPITAL_BASED_OUTPATIENT_CLINIC_OR_DEPARTMENT_OTHER)
Admission: RE | Admit: 2015-11-23 | Discharge: 2015-11-23 | Disposition: A | Discharge status: Home / Self Care | Payer: Medicare Other | Source: Ambulatory Visit | Attending: Orthopedic Surgery | Admitting: Orthopedic Surgery

## 2015-11-23 ENCOUNTER — Encounter (HOSPITAL_BASED_OUTPATIENT_CLINIC_OR_DEPARTMENT_OTHER): Payer: Self-pay | Admitting: *Deleted

## 2015-11-23 DIAGNOSIS — I10 Essential (primary) hypertension: Secondary | ICD-10-CM | POA: Insufficient documentation

## 2015-11-23 DIAGNOSIS — Z01818 Encounter for other preprocedural examination: Secondary | ICD-10-CM | POA: Diagnosis not present

## 2015-11-23 DIAGNOSIS — M1712 Unilateral primary osteoarthritis, left knee: Secondary | ICD-10-CM | POA: Diagnosis not present

## 2015-11-23 DIAGNOSIS — Z79899 Other long term (current) drug therapy: Secondary | ICD-10-CM | POA: Diagnosis not present

## 2015-11-23 DIAGNOSIS — M179 Osteoarthritis of knee, unspecified: Secondary | ICD-10-CM | POA: Diagnosis present

## 2015-11-23 DIAGNOSIS — M1710 Unilateral primary osteoarthritis, unspecified knee: Secondary | ICD-10-CM | POA: Diagnosis present

## 2015-11-23 LAB — BASIC METABOLIC PANEL
ANION GAP: 10 (ref 5–15)
BUN: 16 mg/dL (ref 6–20)
CALCIUM: 9.9 mg/dL (ref 8.9–10.3)
CO2: 24 mmol/L (ref 22–32)
Chloride: 106 mmol/L (ref 101–111)
Creatinine, Ser: 0.84 mg/dL (ref 0.61–1.24)
GFR calc Af Amer: >60 mL/min (ref >60)
GFR calc non Af Amer: >60 mL/min (ref >60)
GLUCOSE: 96 mg/dL (ref 65–99)
Potassium: 3.9 mmol/L (ref 3.5–5.1)
Sodium: 140 mmol/L (ref 135–145)

## 2015-11-23 LAB — SURGICAL PCR SCREEN
MRSA, PCR: NEGATIVE
Staphylococcus aureus: NEGATIVE

## 2015-11-23 NOTE — H&P (Signed)
UNI KNEE ADMISSION H&P  Patient is being admitted for left total knee arthroplasty.  Subjective:  Chief Complaint:left knee pain.  HPI: Corey Bruce, 65 y.o. male, has a history of pain and functional disability in the left knee due to arthritis and has failed non-surgical conservative treatments for greater than 12 weeks to includeNSAID's and/or analgesics, corticosteriod injections, viscosupplementation injections, flexibility and strengthening excercises, supervised PT with diminished ADL's post treatment, weight reduction as appropriate and activity modification.  Onset of symptoms was gradual, starting 10 years ago with gradually worsening course since that time. The patient noted no past surgery on the left knee(s).  Patient currently rates pain in the left knee(s) at 10 out of 10 with activity. Patient has night pain, worsening of pain with activity and weight bearing, pain that interferes with activities of daily living, crepitus and joint swelling.  Patient has evidence of subchondral sclerosis, periarticular osteophytes and joint space narrowing by imaging studies.  There is no active infection.  Patient Active Problem List   Diagnosis Date Noted  . History of rectal bleeding 01/21/2012  . H/O hemorrhoids 01/21/2012  . Encounter for screening colonoscopy for non-high-risk patient 01/21/2012  . Impingement syndrome of right shoulder 03/11/2011  . Pain in joint, shoulder region 03/11/2011   Past Medical History:  Diagnosis Date  . DJD (degenerative joint disease) of knee    left  . HTN (hypertension)     Past Surgical History:  Procedure Laterality Date  . BACK SURGERY     lower  . COLONOSCOPY  02/09/2012   Procedure: COLONOSCOPY;  Surgeon: Daneil Dolin, MD;  Location: [facility];  Service: Endoscopy;  Laterality: N/A;  9:30 had to reschd due to ercp case called patient advised of new time 12:30 for 1:30 case patient stated okay/kr   . HEMORRHOID SURGERY      No  current facility-administered medications for this encounter.   Current Outpatient Prescriptions:  .  losartan (COZAAR) 50 MG tablet, , Disp: , Rfl:     Allergies  Allergen Reactions  . Erythromycin Nausea Only    ORALLY  CAUSED NAUSEA/ IV BURNED VEIN    Social History  Substance Use Topics  . Smoking status: Never Smoker  . Smokeless tobacco: Never Used     Comment: Smoked only about a year as teenager  . Alcohol use No     Comment: remote etoh abuse over 26 years ago    Family History  Problem Relation Age of Onset  . Diabetes    . Colon cancer Neg Hx      Review of Systems  Constitutional: Negative.   HENT: Negative.   Eyes: Negative.   Respiratory: Negative.   Cardiovascular: Negative.   Gastrointestinal: Negative.   Genitourinary: Negative.   Musculoskeletal: Positive for joint pain.  Skin: Negative.   Neurological: Negative.   Endo/Heme/Allergies: Negative.   Psychiatric/Behavioral: Negative.     Objective:  Physical Exam  Constitutional: He is oriented to person, place, and time. He appears well-developed and well-nourished.  HENT:  Head: Normocephalic and atraumatic.  Mouth/Throat: Oropharynx is clear and moist.  Eyes: EOM are normal. Pupils are equal, round, and reactive to light.  Neck: Neck supple.  Cardiovascular: Normal rate.   Respiratory: Effort normal.  GI: Soft.  Genitourinary:  Genitourinary Comments: Not pertinent to current symptomatology therefore not examined.  Musculoskeletal:  Examination of his left knee reveals pain medially  1+ synovitis. Moderate varus deformity. Full range of motion.  Knee is  stable. Examination of his right shoulder reveals pain with decreased range of motion by 30%. Mild weakness on rotator cuff stressing. No instability. Exam of his left shoulder reveals full range of motion without pain, weakness or instability.  Vascular exam: Pulses are 2+ and symmetric.     Neurological: He is oriented to person, place, and  time.  Skin: Skin is warm and dry.  Psychiatric: He has a normal mood and affect.    Vital signs in last 24 hours: Weight:  [101.2 kg (223 lb)] 101.2 kg (223 lb) (09/22 1237)  Labs:   Estimated body mass index is 29.42 kg/m as calculated from the following:   Height as of this encounter: 6\' 1"  (1.854 m).   Weight as of this encounter: 101.2 kg (223 lb).   Imaging Review Plain radiographs demonstrate severe degenerative joint disease of the left knee(s). The overall alignment issignificant varus. The bone quality appears to be good for age and reported activity level.  Assessment/Plan:  End stage arthritis, left knee  Active Problems:   Primary localized osteoarthritis of left knee   HTN (hypertension)   DJD (degenerative joint disease) of knee   The patient history, physical examination, clinical judgment of the provider and imaging studies are consistent with end stage degenerative joint disease of the left knee(s) and total knee arthroplasty is deemed medically necessary. The treatment options including medical management, injection therapy arthroscopy and arthroplasty were discussed at length. The risks and benefits of total knee arthroplasty were presented and reviewed. The risks due to aseptic loosening, infection, stiffness, patella tracking problems, thromboembolic complications and other imponderables were discussed. The patient acknowledged the explanation, agreed to proceed with the plan and consent was signed. Patient is being admitted for inpatient treatment for surgery, pain control, PT, OT, prophylactic antibiotics, VTE prophylaxis, progressive ambulation and ADL's and discharge planning. The patient is planning to be discharged home with home health services

## 2015-11-26 NOTE — Progress Notes (Signed)
MRSA swabbed preformed and results were neg , no med needed

## 2015-12-02 NOTE — Anesthesia Preprocedure Evaluation (Addendum)
Anesthesia Evaluation  Patient identified by MRN, date of birth, ID band Patient awake    Reviewed: Allergy & Precautions, NPO status , Patient's Chart, lab work & pertinent test results  Airway Mallampati: II  TM Distance: >3 FB Neck ROM: Full    Dental no notable dental hx.    Pulmonary neg pulmonary ROS,    Pulmonary exam normal        Cardiovascular hypertension, Pt. on medications Normal cardiovascular exam     Neuro/Psych negative neurological ROS     GI/Hepatic negative GI ROS, Neg liver ROS,   Endo/Other  negative endocrine ROS  Renal/GU negative Renal ROS     Musculoskeletal  (+) Arthritis ,   Abdominal   Peds  Hematology negative hematology ROS (+)   Anesthesia Other Findings   Reproductive/Obstetrics                            Anesthesia Physical Anesthesia Plan  ASA: II  Anesthesia Plan: General and Regional   Post-op Pain Management:  Regional for Post-op pain   Induction: Intravenous  Airway Management Planned: LMA  Additional Equipment:   Intra-op Plan:   Post-operative Plan: Extubation in OR  Informed Consent: I have reviewed the patients History and Physical, chart, labs and discussed the procedure including the risks, benefits and alternatives for the proposed anesthesia with the patient or authorized representative who has indicated his/her understanding and acceptance.   Dental advisory given  Plan Discussed with: CRNA  Anesthesia Plan Comments:        Anesthesia Quick Evaluation

## 2015-12-03 ENCOUNTER — Ambulatory Visit (HOSPITAL_BASED_OUTPATIENT_CLINIC_OR_DEPARTMENT_OTHER): Payer: Medicare Other | Admitting: Certified Registered"

## 2015-12-03 ENCOUNTER — Encounter (HOSPITAL_BASED_OUTPATIENT_CLINIC_OR_DEPARTMENT_OTHER): Payer: Self-pay | Admitting: *Deleted

## 2015-12-03 ENCOUNTER — Encounter (HOSPITAL_BASED_OUTPATIENT_CLINIC_OR_DEPARTMENT_OTHER)
Admission: RE | Disposition: A | Discharge status: Home / Self Care | Payer: Self-pay | Source: Ambulatory Visit | Attending: Orthopedic Surgery

## 2015-12-03 ENCOUNTER — Ambulatory Visit (HOSPITAL_BASED_OUTPATIENT_CLINIC_OR_DEPARTMENT_OTHER)
Admission: RE | Admit: 2015-12-03 | Discharge: 2015-12-04 | Disposition: A | Discharge status: Home / Self Care | Payer: Medicare Other | Source: Ambulatory Visit | Attending: Orthopedic Surgery | Admitting: Orthopedic Surgery

## 2015-12-03 DIAGNOSIS — M1712 Unilateral primary osteoarthritis, left knee: Secondary | ICD-10-CM | POA: Diagnosis present

## 2015-12-03 DIAGNOSIS — M179 Osteoarthritis of knee, unspecified: Secondary | ICD-10-CM | POA: Diagnosis present

## 2015-12-03 DIAGNOSIS — M25562 Pain in left knee: Secondary | ICD-10-CM | POA: Diagnosis not present

## 2015-12-03 DIAGNOSIS — I10 Essential (primary) hypertension: Secondary | ICD-10-CM | POA: Diagnosis present

## 2015-12-03 DIAGNOSIS — G8918 Other acute postprocedural pain: Secondary | ICD-10-CM | POA: Diagnosis not present

## 2015-12-03 DIAGNOSIS — M1710 Unilateral primary osteoarthritis, unspecified knee: Secondary | ICD-10-CM | POA: Diagnosis present

## 2015-12-03 HISTORY — DX: Osteoarthritis of knee, unspecified: M17.9

## 2015-12-03 HISTORY — DX: Unilateral primary osteoarthritis, unspecified knee: M17.10

## 2015-12-03 HISTORY — PX: PARTIAL KNEE ARTHROPLASTY: SHX2174

## 2015-12-03 SURGERY — ARTHROPLASTY, KNEE, UNICOMPARTMENTAL
Anesthesia: Regional | Site: Knee | Laterality: Left

## 2015-12-03 MED ORDER — BUPIVACAINE-EPINEPHRINE (PF) 0.25% -1:200000 IJ SOLN
INTRAMUSCULAR | Status: AC
  Filled 2015-12-03: qty 30

## 2015-12-03 MED ORDER — ONDANSETRON HCL 4 MG/2ML IJ SOLN
INTRAMUSCULAR | Status: AC
  Filled 2015-12-03: qty 2

## 2015-12-03 MED ORDER — HYDROMORPHONE HCL 1 MG/ML IJ SOLN
1.0000 mg | INTRAMUSCULAR | Status: AC | PRN
  Administered 2015-12-03 (×4): 0.5 mg via INTRAVENOUS

## 2015-12-03 MED ORDER — SODIUM CHLORIDE 0.9 % IR SOLN
Status: DC | PRN
  Administered 2015-12-03: 2000 mL
  Administered 2015-12-03: 1000 mL

## 2015-12-03 MED ORDER — CHLORHEXIDINE GLUCONATE 4 % EX LIQD: 60.0000 mL | Freq: Once | CUTANEOUS | Status: DC

## 2015-12-03 MED ORDER — DEXAMETHASONE SODIUM PHOSPHATE 4 MG/ML IJ SOLN
INTRAMUSCULAR | Status: DC | PRN
  Administered 2015-12-03: 10 mg via INTRAVENOUS

## 2015-12-03 MED ORDER — SODIUM CHLORIDE 0.9 % IV SOLN
INTRAVENOUS | Status: DC
  Administered 2015-12-03: 14:00:00 via INTRAVENOUS

## 2015-12-03 MED ORDER — HYDROMORPHONE HCL 1 MG/ML IJ SOLN
INTRAMUSCULAR | Status: AC
  Filled 2015-12-03: qty 1

## 2015-12-03 MED ORDER — FENTANYL CITRATE (PF) 100 MCG/2ML IJ SOLN
50.0000 ug | INTRAMUSCULAR | Status: DC | PRN
  Administered 2015-12-03: 50 ug via INTRAVENOUS

## 2015-12-03 MED ORDER — LACTATED RINGERS IV SOLN
INTRAVENOUS | Status: DC
  Administered 2015-12-03: 10 mL/h via INTRAVENOUS
  Administered 2015-12-03 (×2): via INTRAVENOUS

## 2015-12-03 MED ORDER — DEXAMETHASONE SODIUM PHOSPHATE 10 MG/ML IJ SOLN
INTRAMUSCULAR | Status: AC
  Filled 2015-12-03: qty 1

## 2015-12-03 MED ORDER — OXYCODONE HCL 5 MG PO TABS: ORAL_TABLET | ORAL | 0 refills | Status: DC

## 2015-12-03 MED ORDER — GLYCOPYRROLATE 0.2 MG/ML IJ SOLN: 0.2000 mg | Freq: Once | INTRAMUSCULAR | Status: DC | PRN

## 2015-12-03 MED ORDER — PROPOFOL 10 MG/ML IV BOLUS
INTRAVENOUS | Status: AC
  Filled 2015-12-03: qty 20

## 2015-12-03 MED ORDER — POVIDONE-IODINE 7.5 % EX SOLN: Freq: Once | CUTANEOUS | Status: DC

## 2015-12-03 MED ORDER — OXYCODONE HCL 5 MG PO TABS
5.0000 mg | ORAL_TABLET | ORAL | Status: DC | PRN
  Administered 2015-12-03: 10 mg via ORAL
  Administered 2015-12-03 (×2): 5 mg via ORAL
  Administered 2015-12-04 (×2): 10 mg via ORAL
  Filled 2015-12-03: qty 1
  Filled 2015-12-03: qty 2
  Filled 2015-12-03: qty 1
  Filled 2015-12-03 (×2): qty 2

## 2015-12-03 MED ORDER — METOCLOPRAMIDE HCL 5 MG PO TABS: 5.0000 mg | ORAL_TABLET | Freq: Three times a day (TID) | ORAL | Status: DC | PRN

## 2015-12-03 MED ORDER — EPHEDRINE 5 MG/ML INJ
INTRAVENOUS | Status: AC
  Filled 2015-12-03: qty 10

## 2015-12-03 MED ORDER — LACTATED RINGERS IV SOLN: INTRAVENOUS | Status: DC

## 2015-12-03 MED ORDER — SUCCINYLCHOLINE CHLORIDE 200 MG/10ML IV SOSY
PREFILLED_SYRINGE | INTRAVENOUS | Status: AC
  Filled 2015-12-03: qty 10

## 2015-12-03 MED ORDER — MIDAZOLAM HCL 2 MG/2ML IJ SOLN
INTRAMUSCULAR | Status: AC
  Filled 2015-12-03: qty 2

## 2015-12-03 MED ORDER — SCOPOLAMINE 1 MG/3DAYS TD PT72: 1.0000 | MEDICATED_PATCH | Freq: Once | TRANSDERMAL | Status: DC | PRN

## 2015-12-03 MED ORDER — POLYETHYLENE GLYCOL 3350 17 G PO PACK: 17.0000 g | PACK | Freq: Two times a day (BID) | ORAL | Status: DC

## 2015-12-03 MED ORDER — ONDANSETRON HCL 4 MG/2ML IJ SOLN: 4.0000 mg | Freq: Four times a day (QID) | INTRAMUSCULAR | Status: DC | PRN

## 2015-12-03 MED ORDER — LIDOCAINE HCL (CARDIAC) 20 MG/ML IV SOLN
INTRAVENOUS | Status: DC | PRN
  Administered 2015-12-03: 100 mg via INTRAVENOUS

## 2015-12-03 MED ORDER — CEFAZOLIN SODIUM-DEXTROSE 2-4 GM/100ML-% IV SOLN
2.0000 g | INTRAVENOUS | Status: AC
  Administered 2015-12-03: 2 g via INTRAVENOUS

## 2015-12-03 MED ORDER — METOCLOPRAMIDE HCL 5 MG/ML IJ SOLN: 5.0000 mg | Freq: Three times a day (TID) | INTRAMUSCULAR | Status: DC | PRN

## 2015-12-03 MED ORDER — FENTANYL CITRATE (PF) 100 MCG/2ML IJ SOLN
INTRAMUSCULAR | Status: AC
  Filled 2015-12-03: qty 2

## 2015-12-03 MED ORDER — SUFENTANIL CITRATE 50 MCG/ML IV SOLN
INTRAVENOUS | Status: DC | PRN
  Administered 2015-12-03: 5 ug via INTRAVENOUS

## 2015-12-03 MED ORDER — CEFAZOLIN SODIUM-DEXTROSE 2-4 GM/100ML-% IV SOLN
2.0000 g | Freq: Four times a day (QID) | INTRAVENOUS | Status: AC
  Administered 2015-12-03 – 2015-12-04 (×2): 2 g via INTRAVENOUS
  Filled 2015-12-03 (×2): qty 100

## 2015-12-03 MED ORDER — HYDROMORPHONE HCL 1 MG/ML IJ SOLN
0.5000 mg | INTRAMUSCULAR | Status: DC | PRN
  Administered 2015-12-04 (×2): 1 mg via INTRAVENOUS
  Filled 2015-12-03 (×2): qty 1

## 2015-12-03 MED ORDER — PHENYLEPHRINE 40 MCG/ML (10ML) SYRINGE FOR IV PUSH (FOR BLOOD PRESSURE SUPPORT)
PREFILLED_SYRINGE | INTRAVENOUS | Status: AC
  Filled 2015-12-03: qty 10

## 2015-12-03 MED ORDER — BUPIVACAINE-EPINEPHRINE (PF) 0.5% -1:200000 IJ SOLN
INTRAMUSCULAR | Status: DC | PRN
  Administered 2015-12-03: 25 mL via PERINEURAL

## 2015-12-03 MED ORDER — ONDANSETRON HCL 4 MG/2ML IJ SOLN
INTRAMUSCULAR | Status: DC | PRN
  Administered 2015-12-03: 4 mg via INTRAVENOUS

## 2015-12-03 MED ORDER — CEFAZOLIN SODIUM-DEXTROSE 2-4 GM/100ML-% IV SOLN
INTRAVENOUS | Status: AC
  Filled 2015-12-03: qty 100

## 2015-12-03 MED ORDER — FENTANYL CITRATE (PF) 100 MCG/2ML IJ SOLN: 25.0000 ug | INTRAMUSCULAR | Status: DC | PRN

## 2015-12-03 MED ORDER — MIDAZOLAM HCL 2 MG/2ML IJ SOLN
1.0000 mg | INTRAMUSCULAR | Status: DC | PRN
  Administered 2015-12-03: 1 mg via INTRAVENOUS
  Administered 2015-12-03: 2 mg via INTRAVENOUS

## 2015-12-03 MED ORDER — ONDANSETRON HCL 4 MG PO TABS: 4.0000 mg | ORAL_TABLET | Freq: Four times a day (QID) | ORAL | Status: DC | PRN

## 2015-12-03 MED ORDER — LIDOCAINE 2% (20 MG/ML) 5 ML SYRINGE
INTRAMUSCULAR | Status: AC
  Filled 2015-12-03: qty 5

## 2015-12-03 MED ORDER — BUPIVACAINE-EPINEPHRINE 0.25% -1:200000 IJ SOLN
INTRAMUSCULAR | Status: DC | PRN
  Administered 2015-12-03: 20 mL

## 2015-12-03 MED ORDER — PROPOFOL 10 MG/ML IV BOLUS
INTRAVENOUS | Status: DC | PRN
  Administered 2015-12-03: 250 mg via INTRAVENOUS

## 2015-12-03 MED ORDER — SUFENTANIL CITRATE 50 MCG/ML IV SOLN
INTRAVENOUS | Status: AC
  Filled 2015-12-03: qty 1

## 2015-12-03 MED ORDER — DOCUSATE SODIUM 100 MG PO CAPS
100.0000 mg | ORAL_CAPSULE | Freq: Two times a day (BID) | ORAL | Status: DC
  Administered 2015-12-03: 100 mg via ORAL
  Filled 2015-12-03: qty 1

## 2015-12-03 MED ORDER — PROMETHAZINE HCL 25 MG/ML IJ SOLN
6.2500 mg | INTRAMUSCULAR | Status: DC | PRN
Start: 2015-12-03 — End: 2015-12-04

## 2015-12-03 SURGICAL SUPPLY — 70 items
AQUACEL ×3 IMPLANT
BANDAGE ACE 6X5 VEL STRL LF (GAUZE/BANDAGES/DRESSINGS) ×3 IMPLANT
BANDAGE ESMARK 6X9 LF (GAUZE/BANDAGES/DRESSINGS) ×1 IMPLANT
BLADE HEX COATED 2.75 (ELECTRODE) ×3 IMPLANT
BLADE SURG 10 STRL SS (BLADE) ×6 IMPLANT
BLADE SURG 15 STRL LF DISP TIS (BLADE) ×2 IMPLANT
BLADE SURG 15 STRL SS (BLADE) ×4
BNDG ESMARK 6X9 LF (GAUZE/BANDAGES/DRESSINGS) ×3
BOWL SMART MIX CTS (DISPOSABLE) ×3 IMPLANT
CAPT KNEE PARTIAL 2 ×3 IMPLANT
CEMENT HV SMART SET (Cement) ×3 IMPLANT
CLOSURE WOUND 1/2 X4 (GAUZE/BANDAGES/DRESSINGS) ×1
COVER BACK TABLE 60X90IN (DRAPES) ×3 IMPLANT
CUFF TOURNIQUET SINGLE 34IN LL (TOURNIQUET CUFF) ×3 IMPLANT
DRAPE EXTREMITY T 121X128X90 (DRAPE) ×3 IMPLANT
DRAPE IMP U-DRAPE 54X76 (DRAPES) IMPLANT
DRAPE INCISE IOBAN 66X45 STRL (DRAPES) ×3 IMPLANT
DRAPE OEC MINIVIEW 54X84 (DRAPES) ×3 IMPLANT
DRAPE U-SHAPE 47X51 STRL (DRAPES) ×6 IMPLANT
DRSG PAD ABDOMINAL 8X10 ST (GAUZE/BANDAGES/DRESSINGS) IMPLANT
DURAPREP 26ML APPLICATOR (WOUND CARE) ×3 IMPLANT
ELECT REM PT RETURN 9FT ADLT (ELECTROSURGICAL) ×3
ELECTRODE REM PT RTRN 9FT ADLT (ELECTROSURGICAL) ×1 IMPLANT
FACESHIELD WRAPAROUND (MASK) ×3 IMPLANT
GAUZE SPONGE 4X4 12PLY STRL (GAUZE/BANDAGES/DRESSINGS) ×3 IMPLANT
GLOVE BIO SURGEON STRL SZ 6.5 (GLOVE) ×2 IMPLANT
GLOVE BIO SURGEON STRL SZ7 (GLOVE) ×6 IMPLANT
GLOVE BIO SURGEONS STRL SZ 6.5 (GLOVE) ×1
GLOVE BIOGEL PI IND STRL 7.0 (GLOVE) ×4 IMPLANT
GLOVE BIOGEL PI IND STRL 7.5 (GLOVE) ×1 IMPLANT
GLOVE BIOGEL PI INDICATOR 7.0 (GLOVE) ×8
GLOVE BIOGEL PI INDICATOR 7.5 (GLOVE) ×2
GLOVE SS BIOGEL STRL SZ 7.5 (GLOVE) ×1 IMPLANT
GLOVE SUPERSENSE BIOGEL SZ 7.5 (GLOVE) ×2
GOWN STRL REUS W/ TWL LRG LVL3 (GOWN DISPOSABLE) ×1 IMPLANT
GOWN STRL REUS W/ TWL XL LVL3 (GOWN DISPOSABLE) ×2 IMPLANT
GOWN STRL REUS W/TWL LRG LVL3 (GOWN DISPOSABLE) ×2
GOWN STRL REUS W/TWL XL LVL3 (GOWN DISPOSABLE) ×4
HANDPIECE INTERPULSE COAX TIP (DISPOSABLE) ×2
IMMOBILIZER KNEE 22 UNIV (SOFTGOODS) IMPLANT
IMMOBILIZER KNEE 24 THIGH 36 (MISCELLANEOUS) ×1 IMPLANT
IMMOBILIZER KNEE 24 UNIV (MISCELLANEOUS) ×3
MANIFOLD NEPTUNE II (INSTRUMENTS) ×3 IMPLANT
NS IRRIG 1000ML POUR BTL (IV SOLUTION) ×3 IMPLANT
PACK ARTHROSCOPY DSU (CUSTOM PROCEDURE TRAY) ×3 IMPLANT
PACK BASIN DAY SURGERY FS (CUSTOM PROCEDURE TRAY) ×3 IMPLANT
PACK BLADE SAW RECIP 70 3 PT (BLADE) ×3 IMPLANT
PACK UNIVERSAL I (CUSTOM PROCEDURE TRAY) ×3 IMPLANT
PENCIL BUTTON HOLSTER BLD 10FT (ELECTRODE) ×3 IMPLANT
PILLOW KNEE EXTENSION 0 DEG (MISCELLANEOUS) ×3 IMPLANT
SET HNDPC FAN SPRY TIP SCT (DISPOSABLE) ×1 IMPLANT
SLEEVE SCD COMPRESS KNEE MED (MISCELLANEOUS) ×3 IMPLANT
SPONGE LAP 18X18 X RAY DECT (DISPOSABLE) ×6 IMPLANT
STAPLER VISISTAT 35W (STAPLE) IMPLANT
STRIP CLOSURE SKIN 1/2X4 (GAUZE/BANDAGES/DRESSINGS) ×2 IMPLANT
SUCTION FRAZIER HANDLE 10FR (MISCELLANEOUS) ×2
SUCTION TUBE FRAZIER 10FR DISP (MISCELLANEOUS) ×1 IMPLANT
SUT MNCRL AB 3-0 PS2 18 (SUTURE) ×3 IMPLANT
SUT VIC AB 0 CT1 27 (SUTURE) ×2
SUT VIC AB 0 CT1 27XBRD ANBCTR (SUTURE) ×1 IMPLANT
SUT VIC AB 1 CT1 27 (SUTURE) ×2
SUT VIC AB 1 CT1 27XBRD ANBCTR (SUTURE) ×1 IMPLANT
SUT VIC AB 2-0 CT1 27 (SUTURE) ×2
SUT VIC AB 2-0 CT1 TAPERPNT 27 (SUTURE) ×1 IMPLANT
SUT VIC AB 2-0 SH 27 (SUTURE)
SUT VIC AB 2-0 SH 27XBRD (SUTURE) IMPLANT
SYR BULB IRRIGATION 50ML (SYRINGE) ×3 IMPLANT
TOWEL OR 17X24 6PK STRL BLUE (TOWEL DISPOSABLE) ×6 IMPLANT
TOWEL OR NON WOVEN STRL DISP B (DISPOSABLE) ×6 IMPLANT
YANKAUER SUCT BULB TIP NO VENT (SUCTIONS) ×3 IMPLANT

## 2015-12-03 NOTE — Interval H&P Note (Signed)
History and Physical Interval Note:  12/03/2015 9:01 AM  Corey Bruce  has presented today for surgery, with the diagnosis of Left Knee Primary localized OA  The various methods of treatment have been discussed with the patient and family. After consideration of risks, benefits and other options for treatment, the patient has consented to  Procedure(s): LEFT UNICOMPARTMENTAL KNEE (Left) as a surgical intervention .  The patient's history has been reviewed, patient examined, no change in status, stable for surgery.  I have reviewed the patient's chart and labs.  Questions were answered to the patient's satisfaction.     Elsie Saas A

## 2015-12-03 NOTE — Progress Notes (Signed)
Assisted Dr.Richard Guidetti with left, ultrasound guided, adductor canal block. Side rails up, monitors on throughout procedure. See vital signs in flow sheet. Tolerated Procedure well.

## 2015-12-03 NOTE — Transfer of Care (Signed)
Immediate Anesthesia Transfer of Care Note  Patient: Corey Bruce  Procedure(s) Performed: Procedure(s): LEFT UNICOMPARTMENTAL KNEE (Left)  Patient Location: PACU  Anesthesia Type:General  Level of Consciousness: sedated  Airway & Oxygen Therapy: Patient Spontanous Breathing and Patient connected to face mask oxygen  Post-op Assessment: Report given to RN and Post -op Vital signs reviewed and stable  Post vital signs: Reviewed and stable  Last Vitals:  Vitals:   12/03/15 0822 12/03/15 0823  BP: 118/78   Pulse: 62 62  Resp: 17 14  Temp:      Last Pain:  Vitals:   12/03/15 0757  TempSrc: Oral  PainSc: 1       Patients Stated Pain Goal: 0 ([id number])  Complications: No apparent anesthesia complications

## 2015-12-03 NOTE — Anesthesia Procedure Notes (Signed)
Anesthesia Regional Block:  Adductor canal block  Pre-Anesthetic Checklist: ,, timeout performed, Correct Patient, Correct Site, Correct Laterality, Correct Procedure, Correct Position, site marked, Risks and benefits discussed,  Surgical consent,  Pre-op evaluation,  At surgeon's request and post-op pain management  Laterality: Left  Prep: chloraprep       Needles:   Needle Type: Echogenic Needle     Needle Length: 5cm 5 cm Needle Gauge: 22 and 22 G    Additional Needles:  Procedures: ultrasound guided (picture in chart) Adductor canal block Narrative:  Start time: 12/03/2015 8:15 AM End time: 12/03/2015 8:20 AM Injection made incrementally with aspirations every 25 mL.  Performed by: Personally  Anesthesiologist: Reginal Lutes  Additional Notes: Patient tolerated procedure well.

## 2015-12-03 NOTE — Anesthesia Procedure Notes (Signed)
Procedure Name: LMA Insertion Date/Time: 12/03/2015 9:29 AM Performed by: Melynda Ripple D Pre-anesthesia Checklist: Patient identified, Emergency Drugs available, Suction available and Patient being monitored Patient Re-evaluated:Patient Re-evaluated prior to inductionOxygen Delivery Method: Circle system utilized Preoxygenation: Pre-oxygenation with 100% oxygen Intubation Type: IV induction Ventilation: Mask ventilation without difficulty LMA: LMA inserted LMA Size: 5.0 Number of attempts: 1 Airway Equipment and Method: Bite block Placement Confirmation: positive ETCO2 Tube secured with: Tape Dental Injury: Teeth and Oropharynx as per pre-operative assessment

## 2015-12-03 NOTE — Anesthesia Postprocedure Evaluation (Signed)
Anesthesia Post Note  Patient: Corey Bruce  Procedure(s) Performed: Procedure(s) (LRB): LEFT UNICOMPARTMENTAL KNEE (Left)  Patient location during evaluation: PACU Anesthesia Type: General Level of consciousness: awake and alert Pain management: pain level controlled Vital Signs Assessment: post-procedure vital signs reviewed and stable Respiratory status: spontaneous breathing, nonlabored ventilation, respiratory function stable and patient connected to nasal cannula oxygen Cardiovascular status: blood pressure returned to baseline and stable Postop Assessment: no signs of nausea or vomiting Anesthetic complications: no    Last Vitals:  Vitals:   12/03/15 1215 12/03/15 1230  BP: (!) 161/97 (!) 162/98  Pulse: 76   Resp: (!) 25 16  Temp:      Last Pain:  Vitals:   12/03/15 1230  TempSrc:   PainSc: [name]

## 2015-12-04 DIAGNOSIS — I10 Essential (primary) hypertension: Secondary | ICD-10-CM | POA: Diagnosis not present

## 2015-12-04 DIAGNOSIS — M1712 Unilateral primary osteoarthritis, left knee: Secondary | ICD-10-CM | POA: Diagnosis not present

## 2015-12-04 MED ORDER — ASPIRIN EC 325 MG PO TBEC: DELAYED_RELEASE_TABLET | ORAL | 1 refills | Status: DC

## 2015-12-04 MED ORDER — OXYCODONE HCL ER 10 MG PO T12A: EXTENDED_RELEASE_TABLET | ORAL | 0 refills | Status: DC

## 2015-12-04 MED ORDER — DOCUSATE SODIUM 100 MG PO CAPS: ORAL_CAPSULE | ORAL | 0 refills | Status: DC

## 2015-12-04 MED ORDER — OXYCODONE HCL ER 10 MG PO T12A
10.0000 mg | EXTENDED_RELEASE_TABLET | Freq: Two times a day (BID) | ORAL | Status: DC
  Filled 2015-12-04: qty 1

## 2015-12-04 MED ORDER — POLYETHYLENE GLYCOL 3350 17 G PO PACK: PACK | ORAL | 0 refills | Status: DC

## 2015-12-04 MED ORDER — OXYCODONE HCL ER 10 MG PO T12A
10.0000 mg | EXTENDED_RELEASE_TABLET | Freq: Two times a day (BID) | ORAL | Status: DC
  Administered 2015-12-04: 10 mg via ORAL

## 2015-12-04 NOTE — Discharge Summary (Signed)
Patient ID: Corey Bruce MRN: [id number] DOB/AGE: 06/17/1950 65 y.o.  Admit date: 12/03/2015 Discharge date: 12/04/2015  Admission Diagnoses:  Principal Problem:   Primary localized osteoarthritis of left knee Active Problems:   HTN (hypertension)   DJD (degenerative joint disease) of knee   Discharge Diagnoses:  Same  Past Medical History:  Diagnosis Date  . DJD (degenerative joint disease) of knee    left  . HTN (hypertension)   . Primary localized osteoarthritis of left knee     Surgeries: Procedure(s): LEFT UNICOMPARTMENTAL KNEE on 12/03/2015   Consultants:   Discharged Condition: Improved  Hospital Course: SKAI FARAR is an 65 y.o. male who was admitted 12/03/2015 for operative treatment ofPrimary localized osteoarthritis of left knee. Patient has severe unremitting pain that affects sleep, daily activities, and work/hobbies. After pre-op clearance the patient was taken to the operating room on 12/03/2015 and underwent  Procedure(s): LEFT UNICOMPARTMENTAL KNEE.    Patient was given perioperative antibiotics: Anti-infectives    Start     Dose/Rate Route Frequency Ordered Stop   12/03/15 1345  ceFAZolin (ANCEF) IVPB 2g/100 mL premix     2 g 200 mL/hr over 30 Minutes Intravenous Every 6 hours 12/03/15 1338 12/04/15 0744   12/03/15 0740  ceFAZolin (ANCEF) IVPB 2g/100 mL premix     2 g 200 mL/hr over 30 Minutes Intravenous On call to O.R. 12/03/15 0740 12/03/15 0944       Patient was given sequential compression devices, early ambulation, and chemoprophylaxis to prevent DVT.  Patient benefited maximally from hospital stay and there were no complications.    Recent vital signs: Patient Vitals for the past 24 hrs:  BP Temp Pulse Resp SpO2  12/04/15 0700 - - - 18 95 %  12/04/15 0600 - - - 18 95 %  12/04/15 0500 - - - 18 95 %  12/04/15 0430 (!) 147/83 99.7 F (37.6 C) - 18 95 %  12/04/15 0400 - - - 18 96 %  12/04/15 0300 - - - 18 96 %  12/04/15 0224 - - -  18 96 %  12/04/15 0145 - - - 18 96 %  12/04/15 0047 (!) 162/94 99 F (37.2 C) - 18 96 %  12/03/15 2250 - - - 18 96 %  12/03/15 2200 - - - 18 96 %  12/03/15 2130 - - - 18 96 %  12/03/15 2030 (!) 154/84 98.9 F (37.2 C) - 18 97 %  12/03/15 1845 - - - - 97 %  12/03/15 1730 - - - - 97 %  12/03/15 1630 (!) 150/92 97.2 F (36.2 C) 86 18 98 %  12/03/15 1530 (!) 168/90 97.2 F (36.2 C) 86 18 97 %  12/03/15 1430 (!) 169/97 97.6 F (36.4 C) 86 16 98 %  12/03/15 1330 (!) 172/95 - 82 16 95 %  12/03/15 1300 (!) 157/98 - - - -  12/03/15 1245 (!) 158/100 - - - -  12/03/15 1230 (!) 162/98 - 76 14 97 %  12/03/15 1215 (!) 161/97 - 76 (!) 25 97 %  12/03/15 1200 (!) 155/97 - 77 19 96 %  12/03/15 1145 135/88 - 81 17 94 %  12/03/15 1140 120/73 97.6 F (36.4 C) 86 11 95 %  12/03/15 0823 - - 62 14 100 %  12/03/15 0822 118/78 - 62 17 100 %  12/03/15 0821 - - (!) 55 13 100 %  12/03/15 0820 - - (!) 57 11 100 %  12/03/15 0819 - - Marland Kitchen)  58 11 100 %  12/03/15 0818 (!) 138/91 - (!) 59 (!) 9 100 %  12/03/15 0817 - - 63 16 99 %  12/03/15 0816 (!) 144/94 - 67 17 100 %  12/03/15 0815 (!) 158/95 - 66 17 98 %     Recent laboratory studies: No results for input(s): WBC, HGB, HCT, PLT, NA, K, CL, CO2, BUN, CREATININE, GLUCOSE, INR, CALCIUM in the last 72 hours.  Invalid input(s): PT, 2   Discharge Medications:     Medication List    TAKE these medications   aspirin EC 325 MG tablet 1 tab a day for the next 30 days to prevent blood clots   docusate sodium 100 MG capsule Commonly known as:  COLACE 1 tab 2 times a day while on narcotics.  STOOL SOFTENER   losartan 50 MG tablet Commonly known as:  COZAAR   oxyCODONE 5 MG immediate release tablet Commonly known as:  ROXICODONE 1-2 tablets every 4-6 hrs as needed for pain   oxyCODONE 10 mg 12 hr tablet Commonly known as:  OXYCONTIN 1 tablet every twelve hours   LONG ACTING PAIN MEDICATION   polyethylene glycol packet Commonly known as:  MIRALAX /  GLYCOLAX 17grams in 6 oz of water twice a day until bowel movement.  LAXITIVE.  Restart if two days since last bowel movement       Diagnostic Studies: Mr Knee Left  Wo Contrast  Result Date: 11/19/2015 CLINICAL DATA:  Medial left knee pain that worsens with use for 9 months. No known injury. Steroid and "cartilage" injections. No surgery. EXAM: MRI OF THE LEFT KNEE WITHOUT CONTRAST TECHNIQUE: Multiplanar, multisequence MR imaging of the knee was performed. No intravenous contrast was administered. COMPARISON:  None. FINDINGS: MENISCI Medial meniscus: Increased signal in the body and posterior horn of the medial meniscus consistent with degeneration. Oblique tear of the posterior horn of the medial meniscus extending to the inferior articular surface. Mild complex tear of the posterior horn -body junction of the medial meniscus with a small radial tear at the free edge of the body of the medial meniscus. Lateral meniscus:  Intact. LIGAMENTS Cruciates:  Intact ACL and PCL. Collaterals: Medial collateral ligament is intact. Lateral collateral ligament complex is intact. CARTILAGE Patellofemoral: Mild partial-thickness cartilage loss of the medial trochlea. Medial: Extensive full-thickness cartilage loss of the medial femoral condyle and medial tibial plateau with mild subchondral reactive marrow changes and tiny marginal osteophytes. Lateral:  No focal chondral defect. Joint: No joint effusion. Normal Hoffa's fat. No plical thickening. Popliteal Fossa:  No Baker cyst.  Intact popliteus tendon. Extensor Mechanism:  Intact quadriceps tendon and patellar tendon. Bones: No other marrow signal abnormality. No fracture or dislocation. Other: None. IMPRESSION: 1. Extensive full-thickness cartilage loss of the medial femoral condyle and medial tibial plateau with mild subchondral reactive marrow changes. 2. Mild partial-thickness cartilage loss of the medial trochlea. 3. No significant cartilage loss of the lateral  femorotibial compartment. 4. Increased signal in the body and posterior horn of the medial meniscus consistent with degeneration. Oblique tear of the posterior horn of the medial meniscus extending to the inferior articular surface. Mild complex tear of the posterior horn-body junction of the medial meniscus with a small radial tear at the free edge of the body of the medial meniscus. Electronically Signed   By: Kathreen Devoid   On: 11/19/2015 15:20    Disposition: 01-Home or Self Care  Discharge Instructions    CPM    Complete  by:  As directed    Continuous passive motion machine (CPM):      Use the CPM from 0 to 90 for 6 hours per day.       You may break it up into 2 or 3 sessions per day.      Use CPM for 2 weeks or until you are told to stop.   Call MD / Call 911    Complete by:  As directed    If you experience chest pain or shortness of breath, CALL 911 and be transported to the hospital emergency room.  If you develope a fever above 101 F, pus (white drainage) or increased drainage or redness at the wound, or calf pain, call your surgeon's office.   Change dressing    Complete by:  As directed    Change the gauze dressing daily with sterile 4 x 4 inch gauze and apply TED hose.  DO NOT REMOVE BANDAGE OVER SURGICAL INCISION.  Calvin WHOLE LEG INCLUDING OVER THE WATERPROOF BANDAGE WITH SOAP AND WATER EVERY DAY.   Constipation Prevention    Complete by:  As directed    Drink plenty of fluids.  Prune juice may be helpful.  You may use a stool softener, such as Colace (over the counter) 100 mg twice a day.  Use MiraLax (over the counter) for constipation as needed.   Diet - low sodium heart healthy    Complete by:  As directed    Discharge instructions    Complete by:  As directed    INSTRUCTIONS AFTER JOINT REPLACEMENT   Remove items at home which could result in a fall. This includes throw rugs or furniture in walking pathways ICE to the affected joint every three hours while awake for 30  minutes at a time, for at least the first 3-5 days, and then as needed for pain and swelling.  Continue to use ice for pain and swelling. You may notice swelling that will progress down to the foot and ankle.  This is normal after surgery.  Elevate your leg when you are not up walking on it.   Continue to use the breathing machine you got in the hospital (incentive spirometer) which will help keep your temperature down.  It is common for your temperature to cycle up and down following surgery, especially at night when you are not up moving around and exerting yourself.  The breathing machine keeps your lungs expanded and your temperature down.   DIET:  As you were doing prior to hospitalization, we recommend a well-balanced diet.  DRESSING / WOUND CARE / SHOWERING  Keep the surgical dressing until follow up.  The dressing is water proof, so you can shower without any extra covering.  IF THE DRESSING FALLS OFF or the wound gets wet inside, change the dressing with sterile gauze.  Please use good hand washing techniques before changing the dressing.  Do not use any lotions or creams on the incision until instructed by your surgeon.    ACTIVITY  Increase activity slowly as tolerated, but follow the weight bearing instructions below.   No driving for 6 weeks or until further direction given by your physician.  You cannot drive while taking narcotics.  No lifting or carrying greater than 10 lbs. until further directed by your surgeon. Avoid periods of inactivity such as sitting longer than an hour when not asleep. This helps prevent blood clots.  You may return to work once you are authorized by your  doctor.     WEIGHT BEARING   Weight bearing as tolerated with assist device (walker, cane, etc) as directed, use it as long as suggested by your surgeon or therapist, typically at least 2-3 weeks.   EXERCISES  Results after joint replacement surgery are often greatly improved when you follow the  exercise, range of motion and muscle strengthening exercises prescribed by your doctor. Safety measures are also important to protect the joint from further injury. Any time any of these exercises cause you to have increased pain or swelling, decrease what you are doing until you are comfortable again and then slowly increase them. If you have problems or questions, call your caregiver or physical therapist for advice.   Rehabilitation is important following a joint replacement. After just a few days of immobilization, the muscles of the leg can become weakened and shrink (atrophy).  These exercises are designed to build up the tone and strength of the thigh and leg muscles and to improve motion. Often times heat used for twenty to thirty minutes before working out will loosen up your tissues and help with improving the range of motion but do not use heat for the first two weeks following surgery (sometimes heat can increase post-operative swelling).   These exercises can be done on a training (exercise) mat, on the floor, on a table or on a bed. Use whatever works the best and is most comfortable for you.    Use music or television while you are exercising so that the exercises are a pleasant break in your day. This will make your life better with the exercises acting as a break in your routine that you can look forward to.   Perform all exercises about fifteen times, three times per day or as directed.  You should exercise both the operative leg and the other leg as well.   Exercises include:  Quad Sets - Tighten up the muscle on the front of the thigh (Quad) and hold for 5-10 seconds.   Straight Leg Raises - With your knee straight (if you were given a brace, keep it on), lift the leg to 60 degrees, hold for 3 seconds, and slowly lower the leg.  Perform this exercise against resistance later as your leg gets stronger.  Leg Slides: Lying on your back, slowly slide your foot toward your buttocks, bending  your knee up off the floor (only go as far as is comfortable). Then slowly slide your foot back down until your leg is flat on the floor again.  Angel Wings: Lying on your back spread your legs to the side as far apart as you can without causing discomfort.  Hamstring Strength:  Lying on your back, push your heel against the floor with your leg straight by tightening up the muscles of your buttocks.  Repeat, but this time bend your knee to a comfortable angle, and push your heel against the floor.  You may put a pillow under the heel to make it more comfortable if necessary.   A rehabilitation program following joint replacement surgery can speed recovery and prevent re-injury in the future due to weakened muscles. Contact your doctor or a physical therapist for more information on knee rehabilitation.    CONSTIPATION  Constipation is defined medically as fewer than three stools per week and severe constipation as less than one stool per week.  Even if you have a regular bowel pattern at home, your normal regimen is likely to be disrupted due to  multiple reasons following surgery.  Combination of anesthesia, postoperative narcotics, change in appetite and fluid intake all can affect your bowels.   YOU MUST use at least one of the following options; they are listed in order of increasing strength to get the job done.  They are all available over the counter, and you may need to use some, POSSIBLY even all of these options:    Drink plenty of fluids (prune juice may be helpful) and high fiber foods Colace 100 mg by mouth twice a day  Senokot for constipation as directed and as needed Dulcolax (bisacodyl), take with full glass of water  Miralax (polyethylene glycol) once or twice a day as needed.  If you have tried all these things and are unable to have a bowel movement in the first 3-4 days after surgery call either your surgeon or your primary doctor.    If you experience loose stools or  diarrhea, hold the medications until you stool forms back up.  If your symptoms do not get better within 1 week or if they get worse, check with your doctor.  If you experience "the worst abdominal pain ever" or develop nausea or vomiting, please contact the office immediately for further recommendations for treatment.   ITCHING:  If you experience itching with your medications, try taking only a single pain pill, or even half a pain pill at a time.  You can also use Benadryl over the counter for itching or also to help with sleep.   TED HOSE STOCKINGS:  Use stockings on both legs until for at least 2 weeks or as directed by physician office. They may be removed at night for sleeping.  MEDICATIONS:  See your medication summary on the "After Visit Summary" that nursing will review with you.  You may have some home medications which will be placed on hold until you complete the course of blood thinner medication.  It is important for you to complete the blood thinner medication as prescribed.  PRECAUTIONS:  If you experience chest pain or shortness of breath - call 911 immediately for transfer to the hospital emergency department.   If you develop a fever greater that 101 F, purulent drainage from wound, increased redness or drainage from wound, foul odor from the wound/dressing, or calf pain - CONTACT YOUR SURGEON.                                                   FOLLOW-UP APPOINTMENTS:  If you do not already have a post-op appointment, please call the office for an appointment to be seen by your surgeon.  Guidelines for how soon to be seen are listed in your "After Visit Summary", but are typically between 1-4 weeks after surgery.  OTHER INSTRUCTIONS:   Knee Replacement:  Do not place pillow under knee, focus on keeping the knee straight while resting. CPM instructions: 0-90 degrees, 2 hours in the morning, 2 hours in the afternoon, and 2 hours in the evening. Place foam block, curve side up under  heel at all times except when in CPM or when walking.  DO NOT modify, tear, cut, or change the foam block in any way.  MAKE SURE YOU:  Understand these instructions.  Get help right away if you are not doing well or get worse.    Thank you for  letting us be a part of your medical care team.  It is a privilege we respect greatly.  We hope these instructions will help you stay on track for a fast and full recovery!   Do not put a pillow under the knee. Place it under the heel.    Complete by:  As directed    Place gray foam block, curve side up under heel at all times except when in CPM or when walking.  DO NOT modify, tear, cut, or change in any way the gray foam block.   Increase activity slowly as tolerated    Complete by:  As directed    Patient may shower    Complete by:  As directed    Aquacel dressing is water proof    Wash over it and the whole leg with soap and water at the end of your shower   TED hose    Complete by:  As directed    Use stockings (TED hose) for 2 weeks on both leg(s).  You may remove them at night for sleeping.      Follow-up Information    Lorn Junes, MD Follow up on 12/10/2015.   Specialty:  Orthopedic Surgery Why:  appt time 2:45 Contact information: [address] [place] [address] [phone]            Signed: Linda Hedges 12/04/2015, 7:58 AM

## 2015-12-04 NOTE — Op Note (Signed)
NAMECARION, SWEGER             ACCOUNT NO.:  [id number]  MEDICAL RECORD NO.:  [id number]  LOCATION:                                 FACILITY:  PHYSICIAN:  Audree Camel. Noemi Chapel, M.D. DATE OF BIRTH:  Sep 19, 1950  DATE OF PROCEDURE:  12/03/2015 DATE OF DISCHARGE:                              OPERATIVE REPORT   PREOPERATIVE DIAGNOSIS:  Left knee medial compartment primary localized osteoarthritis.  POSTOPERATIVE DIAGNOSIS:  Left knee medial compartment primary localized osteoarthritis.  PROCEDURE:  Left knee unicompartmental arthroplasty using Oxford system with size large cemented femur, size E, cemented tibia with #6 polyethylene spacer.  SURGEON:  Audree Camel. Noemi Chapel, MD.  ASSISTANT:  Kirstin Shepperson, PA-C.  ANESTHESIA:  General.  OPERATIVE TIME:  1 hour and 30 minutes.  COMPLICATIONS:  None.  INDICATION FOR PROCEDURE:  [name] Bruce is a 65 year old gentleman who has had significant left knee pain for 3-4 years, increasing in nature but much worse over the past year with exam, x-rays, and MRI revealing medial compartment osteoarthritis.  All ligaments were normal in the lateral compartment and patellofemoral joint has no significant degenerative changes, thus he is an unicompartmental candidate.  DESCRIPTION OF PROCEDURE:  [name] Bruce was brought to the operating room on December 03, 2015 after an adductor canal block was placed in the holding room by Anesthesia.  He was placed on the operative table in supine position.  He received antibiotics preoperatively for prophylaxis.  After being placed under general anesthesia, his left knee was examined.  He had full range of motion, moderate varus deformity, knee stable, ligamentous exam with normal patellar tracking.  Left leg was prepped using sterile DuraPrep and draped using sterile technique. Time-out procedure was called and the correct left knee identified. Left leg was exsanguinated and a tourniquet elevated to 325  mmHg. Initially, through a 5-6 cm anterior incision, initial exposure was made.  The underlying subcutaneous tissues were incised along with skin incision.  A median arthrotomy was performed revealing an excessive amount of normal-appearing joint fluid.  The articular surfaces were inspected.  He had grade 4 changes medially.  He had no significant degenerative changes laterally or in the patellofemoral joint.  Anterior and posterior cruciate ligaments, medial collateral and lateral collateral ligaments were normal.  At this point, then using the large sizing spoon, the proximal tibial cutting jig was then placed and the appropriate cuts were made and then proximal tibial piece was removed measured for sizes, found that E was the appropriate size.  After this was done, an intramedullary drill was drilled up the femoral canal and then this was linked to the distal femoral jig which was placed in the center of the distal femoral condyle and then the 2 drill holes were made in this in the central portion.  The 0 spigot then placed and then distal femoral reaming was carried out.  After this was done, then the trial components were placed with a large femur and a size E tibia.  In flection a #6 spacer was found to be the appropriate size and on extension, a #4 and thus a #2 spigot was replaced on the distal femur and this reaming was carried out.  After this was done, then the trial components were again placed and #6 was found to be the appropriate size in both flexion and full extension with excellent stability.  At this point, then the tibial keel cut was made.  As well as the distal femoral impingement cuts.  After this was done, it was felt that all the trial components were of excellent size, fit, and stability.  The wound was irrigated and then the size E tibial component with cement backing was hammered in position with an excellent fit with excess cement being removed from around the  edges.  The large femoral component with cement backing was hammered into position with an excellent fit with excess cement being removed from around the edges.  The #6 polyethylene spacer was placed.  The knee brought through a full range of motion, found to be stable with excellent excursion of the polyethylene spacer and excellent stability and excellent correction of his varus deformity. Intraoperative fluoroscopy confirmed excellent position of the components.  At this point, it felt that all pathology been satisfactorily addressed.  The wound was irrigated with 3 L of saline and then the arthrotomy was closed with #1-0 Vicryl suture, subcutaneous tissues closed with 0 and 2-0 Vicryl, subcuticular layer closed with 4-0 Monocryl.  Sterile dressings were applied and a long-leg splint.  The patient was awakened, tourniquet was released, and taken to recovery in stable condition.  Needle, sponge counts correct x2 at the end of the case.  FOLLOWUP CARE:  [name] Bruce is to be followed overnight at the recovery care center for IV pain control, neurovascular monitoring, and CPM use. Discharge tomorrow on oxycodone and Valium with a home CPM.  See me back in the office in a week for sutures out and followup.     Orlo Brickle A. Noemi Chapel, M.D.   ______________________________ Audree Camel. Noemi Chapel, M.D.    RAW/MEDQ  D:  12/03/2015  T:  12/04/2015  Job:  [id number]

## 2015-12-05 ENCOUNTER — Encounter (HOSPITAL_BASED_OUTPATIENT_CLINIC_OR_DEPARTMENT_OTHER): Payer: Self-pay | Admitting: Orthopedic Surgery

## 2015-12-05 DIAGNOSIS — M1712 Unilateral primary osteoarthritis, left knee: Secondary | ICD-10-CM | POA: Diagnosis not present

## 2015-12-05 DIAGNOSIS — M25662 Stiffness of left knee, not elsewhere classified: Secondary | ICD-10-CM | POA: Diagnosis not present

## 2015-12-05 DIAGNOSIS — R262 Difficulty in walking, not elsewhere classified: Secondary | ICD-10-CM | POA: Diagnosis not present

## 2015-12-05 DIAGNOSIS — M25562 Pain in left knee: Secondary | ICD-10-CM | POA: Diagnosis not present

## 2015-12-07 DIAGNOSIS — M1712 Unilateral primary osteoarthritis, left knee: Secondary | ICD-10-CM | POA: Diagnosis not present

## 2015-12-07 DIAGNOSIS — R262 Difficulty in walking, not elsewhere classified: Secondary | ICD-10-CM | POA: Diagnosis not present

## 2015-12-07 DIAGNOSIS — M25662 Stiffness of left knee, not elsewhere classified: Secondary | ICD-10-CM | POA: Diagnosis not present

## 2015-12-07 DIAGNOSIS — M25562 Pain in left knee: Secondary | ICD-10-CM | POA: Diagnosis not present

## 2015-12-10 DIAGNOSIS — R262 Difficulty in walking, not elsewhere classified: Secondary | ICD-10-CM | POA: Diagnosis not present

## 2015-12-10 DIAGNOSIS — M25662 Stiffness of left knee, not elsewhere classified: Secondary | ICD-10-CM | POA: Diagnosis not present

## 2015-12-10 DIAGNOSIS — M1712 Unilateral primary osteoarthritis, left knee: Secondary | ICD-10-CM | POA: Diagnosis not present

## 2015-12-10 DIAGNOSIS — M25562 Pain in left knee: Secondary | ICD-10-CM | POA: Diagnosis not present

## 2015-12-12 DIAGNOSIS — M25662 Stiffness of left knee, not elsewhere classified: Secondary | ICD-10-CM | POA: Diagnosis not present

## 2015-12-12 DIAGNOSIS — R262 Difficulty in walking, not elsewhere classified: Secondary | ICD-10-CM | POA: Diagnosis not present

## 2015-12-12 DIAGNOSIS — M25562 Pain in left knee: Secondary | ICD-10-CM | POA: Diagnosis not present

## 2015-12-12 DIAGNOSIS — M1712 Unilateral primary osteoarthritis, left knee: Secondary | ICD-10-CM | POA: Diagnosis not present

## 2015-12-14 DIAGNOSIS — R262 Difficulty in walking, not elsewhere classified: Secondary | ICD-10-CM | POA: Diagnosis not present

## 2015-12-14 DIAGNOSIS — M1712 Unilateral primary osteoarthritis, left knee: Secondary | ICD-10-CM | POA: Diagnosis not present

## 2015-12-14 DIAGNOSIS — M25562 Pain in left knee: Secondary | ICD-10-CM | POA: Diagnosis not present

## 2015-12-14 DIAGNOSIS — M25662 Stiffness of left knee, not elsewhere classified: Secondary | ICD-10-CM | POA: Diagnosis not present

## 2015-12-17 DIAGNOSIS — M25562 Pain in left knee: Secondary | ICD-10-CM | POA: Diagnosis not present

## 2015-12-17 DIAGNOSIS — M1712 Unilateral primary osteoarthritis, left knee: Secondary | ICD-10-CM | POA: Diagnosis not present

## 2015-12-17 DIAGNOSIS — M25662 Stiffness of left knee, not elsewhere classified: Secondary | ICD-10-CM | POA: Diagnosis not present

## 2015-12-17 DIAGNOSIS — R262 Difficulty in walking, not elsewhere classified: Secondary | ICD-10-CM | POA: Diagnosis not present

## 2015-12-21 DIAGNOSIS — M25562 Pain in left knee: Secondary | ICD-10-CM | POA: Diagnosis not present

## 2015-12-21 DIAGNOSIS — M25662 Stiffness of left knee, not elsewhere classified: Secondary | ICD-10-CM | POA: Diagnosis not present

## 2015-12-21 DIAGNOSIS — M1712 Unilateral primary osteoarthritis, left knee: Secondary | ICD-10-CM | POA: Diagnosis not present

## 2015-12-21 DIAGNOSIS — R262 Difficulty in walking, not elsewhere classified: Secondary | ICD-10-CM | POA: Diagnosis not present

## 2015-12-25 DIAGNOSIS — M1712 Unilateral primary osteoarthritis, left knee: Secondary | ICD-10-CM | POA: Diagnosis not present

## 2015-12-25 DIAGNOSIS — M25662 Stiffness of left knee, not elsewhere classified: Secondary | ICD-10-CM | POA: Diagnosis not present

## 2015-12-25 DIAGNOSIS — R262 Difficulty in walking, not elsewhere classified: Secondary | ICD-10-CM | POA: Diagnosis not present

## 2015-12-25 DIAGNOSIS — M25562 Pain in left knee: Secondary | ICD-10-CM | POA: Diagnosis not present

## 2015-12-27 DIAGNOSIS — R262 Difficulty in walking, not elsewhere classified: Secondary | ICD-10-CM | POA: Diagnosis not present

## 2015-12-27 DIAGNOSIS — M25662 Stiffness of left knee, not elsewhere classified: Secondary | ICD-10-CM | POA: Diagnosis not present

## 2015-12-27 DIAGNOSIS — M1712 Unilateral primary osteoarthritis, left knee: Secondary | ICD-10-CM | POA: Diagnosis not present

## 2015-12-27 DIAGNOSIS — M25562 Pain in left knee: Secondary | ICD-10-CM | POA: Diagnosis not present

## 2016-01-01 DIAGNOSIS — R262 Difficulty in walking, not elsewhere classified: Secondary | ICD-10-CM | POA: Diagnosis not present

## 2016-01-01 DIAGNOSIS — M25562 Pain in left knee: Secondary | ICD-10-CM | POA: Diagnosis not present

## 2016-01-01 DIAGNOSIS — M1712 Unilateral primary osteoarthritis, left knee: Secondary | ICD-10-CM | POA: Diagnosis not present

## 2016-01-01 DIAGNOSIS — M25662 Stiffness of left knee, not elsewhere classified: Secondary | ICD-10-CM | POA: Diagnosis not present

## 2016-01-03 DIAGNOSIS — M25662 Stiffness of left knee, not elsewhere classified: Secondary | ICD-10-CM | POA: Diagnosis not present

## 2016-01-03 DIAGNOSIS — M25562 Pain in left knee: Secondary | ICD-10-CM | POA: Diagnosis not present

## 2016-01-03 DIAGNOSIS — M1712 Unilateral primary osteoarthritis, left knee: Secondary | ICD-10-CM | POA: Diagnosis not present

## 2016-01-03 DIAGNOSIS — R262 Difficulty in walking, not elsewhere classified: Secondary | ICD-10-CM | POA: Diagnosis not present

## 2016-01-08 DIAGNOSIS — M1712 Unilateral primary osteoarthritis, left knee: Secondary | ICD-10-CM | POA: Diagnosis not present

## 2016-01-11 DIAGNOSIS — R262 Difficulty in walking, not elsewhere classified: Secondary | ICD-10-CM | POA: Diagnosis not present

## 2016-01-11 DIAGNOSIS — M25562 Pain in left knee: Secondary | ICD-10-CM | POA: Diagnosis not present

## 2016-01-11 DIAGNOSIS — M25662 Stiffness of left knee, not elsewhere classified: Secondary | ICD-10-CM | POA: Diagnosis not present

## 2016-01-11 DIAGNOSIS — M1712 Unilateral primary osteoarthritis, left knee: Secondary | ICD-10-CM | POA: Diagnosis not present

## 2016-02-01 DIAGNOSIS — M1712 Unilateral primary osteoarthritis, left knee: Secondary | ICD-10-CM | POA: Diagnosis not present

## 2016-02-01 DIAGNOSIS — M25562 Pain in left knee: Secondary | ICD-10-CM | POA: Diagnosis not present

## 2016-02-01 DIAGNOSIS — R262 Difficulty in walking, not elsewhere classified: Secondary | ICD-10-CM | POA: Diagnosis not present

## 2016-02-01 DIAGNOSIS — M25662 Stiffness of left knee, not elsewhere classified: Secondary | ICD-10-CM | POA: Diagnosis not present

## 2016-02-05 DIAGNOSIS — M1712 Unilateral primary osteoarthritis, left knee: Secondary | ICD-10-CM | POA: Diagnosis not present

## 2016-02-05 DIAGNOSIS — M25662 Stiffness of left knee, not elsewhere classified: Secondary | ICD-10-CM | POA: Diagnosis not present

## 2016-02-05 DIAGNOSIS — R262 Difficulty in walking, not elsewhere classified: Secondary | ICD-10-CM | POA: Diagnosis not present

## 2016-02-05 DIAGNOSIS — M25562 Pain in left knee: Secondary | ICD-10-CM | POA: Diagnosis not present

## 2016-02-08 DIAGNOSIS — M25562 Pain in left knee: Secondary | ICD-10-CM | POA: Diagnosis not present

## 2016-02-08 DIAGNOSIS — M25662 Stiffness of left knee, not elsewhere classified: Secondary | ICD-10-CM | POA: Diagnosis not present

## 2016-02-08 DIAGNOSIS — M1712 Unilateral primary osteoarthritis, left knee: Secondary | ICD-10-CM | POA: Diagnosis not present

## 2016-02-08 DIAGNOSIS — R262 Difficulty in walking, not elsewhere classified: Secondary | ICD-10-CM | POA: Diagnosis not present

## 2016-02-19 DIAGNOSIS — M1712 Unilateral primary osteoarthritis, left knee: Secondary | ICD-10-CM | POA: Diagnosis not present

## 2016-03-25 DIAGNOSIS — M1712 Unilateral primary osteoarthritis, left knee: Secondary | ICD-10-CM | POA: Diagnosis not present

## 2016-04-02 DIAGNOSIS — E782 Mixed hyperlipidemia: Secondary | ICD-10-CM | POA: Diagnosis not present

## 2016-04-04 DIAGNOSIS — E782 Mixed hyperlipidemia: Secondary | ICD-10-CM | POA: Diagnosis not present

## 2016-04-04 DIAGNOSIS — G4710 Hypersomnia, unspecified: Secondary | ICD-10-CM | POA: Diagnosis not present

## 2016-04-04 DIAGNOSIS — I10 Essential (primary) hypertension: Secondary | ICD-10-CM | POA: Diagnosis not present

## 2016-04-04 DIAGNOSIS — E663 Overweight: Secondary | ICD-10-CM | POA: Diagnosis not present

## 2016-04-04 DIAGNOSIS — M25562 Pain in left knee: Secondary | ICD-10-CM | POA: Diagnosis not present

## 2016-04-04 DIAGNOSIS — Z0000 Encounter for general adult medical examination without abnormal findings: Secondary | ICD-10-CM | POA: Diagnosis not present

## 2016-04-15 ENCOUNTER — Other Ambulatory Visit (HOSPITAL_BASED_OUTPATIENT_CLINIC_OR_DEPARTMENT_OTHER): Payer: Self-pay

## 2016-04-15 DIAGNOSIS — G4710 Hypersomnia, unspecified: Secondary | ICD-10-CM

## 2016-04-24 ENCOUNTER — Encounter (INDEPENDENT_AMBULATORY_CARE_PROVIDER_SITE_OTHER): Payer: Self-pay

## 2016-04-24 ENCOUNTER — Ambulatory Visit: Payer: Medicare Other | Attending: Internal Medicine | Admitting: Neurology

## 2016-04-24 DIAGNOSIS — G4733 Obstructive sleep apnea (adult) (pediatric): Secondary | ICD-10-CM | POA: Insufficient documentation

## 2016-04-24 DIAGNOSIS — G4710 Hypersomnia, unspecified: Secondary | ICD-10-CM | POA: Insufficient documentation

## 2016-04-26 NOTE — Procedures (Signed)
[place] A. Merlene Laughter, MD     [email or web address]             NOCTURNAL POLYSOMNOGRAPHY   LOCATION: [facility]  Patient Name: Corey Bruce, Corey Bruce Date: 04/24/2016 Gender: Male D.O.B: 1950/04/15 Age (years): 41 Referring Provider: Delphina Cahill Height (inches): 73 Interpreting Physician: Phillips Odor MD, ABSM Weight (lbs): 225 RPSGT: Peak, Robert BMI: 30 MRN: [id number] Neck Size: 16.00 CLINICAL INFORMATION Sleep Study Type: Split Night CPAP  Indication for sleep study: N/A  Epworth Sleepiness Score:  SLEEP STUDY TECHNIQUE As per the AASM Manual for the Scoring of Sleep and Associated Events v2.3 (April 2016) with a hypopnea requiring 4% desaturations.  The channels recorded and monitored were frontal, central and occipital EEG, electrooculogram (EOG), submentalis EMG (chin), nasal and oral airflow, thoracic and abdominal wall motion, anterior tibialis EMG, snore microphone, electrocardiogram, and pulse oximetry. Continuous positive airway pressure (CPAP) was initiated when the patient met split night criteria and was titrated according to treat sleep-disordered breathing.  MEDICATIONS Medications self-administered by patient taken the night of the study : N/A  Current Outpatient Prescriptions:  .  aspirin EC 325 MG tablet, 1 tab a day for the next 30 days to prevent blood clots, Disp: 30 tablet, Rfl: 1 .  docusate sodium (COLACE) 100 MG capsule, 1 tab 2 times a day while on narcotics.  STOOL SOFTENER, Disp: 60 capsule, Rfl: 0 .  losartan (COZAAR) 50 MG tablet, , Disp: , Rfl:  .  oxyCODONE (OXYCONTIN) 10 mg 12 hr tablet, 1 tablet every twelve hours   LONG ACTING PAIN MEDICATION, Disp: 15 tablet, Rfl: 0 .  oxyCODONE (ROXICODONE) 5 MG immediate release tablet, 1-2 tablets every 4-6 hrs as needed for pain, Disp: 60 tablet, Rfl: 0 .  polyethylene glycol (MIRALAX / GLYCOLAX) packet, 17grams in 6 oz of water twice a day until bowel movement.  LAXITIVE.   Restart if two days since last bowel movement, Disp: 14 each, Rfl: 0   RESPIRATORY PARAMETERS Diagnostic  Total AHI (/hr): 17.0 RDI (/hr): 55.2 OA Index (/hr): 3.2 CA Index (/hr): 0.0 REM AHI (/hr): 47.3 NREM AHI (/hr): 12.6 Supine AHI (/hr): 51.1 Non-supine AHI (/hr): 9.53 Min O2 Sat (%): 87.00 Mean O2 (%): 95.12 Time below 88% (min): 0.6   Titration  Optimal Pressure (cm): 9 AHI at Optimal Pressure (/hr): 0.0 Min O2 at Optimal Pressure (%): 0.0 Supine % at Optimal (%): 5 Sleep % at Optimal (%): 80   SLEEP ARCHITECTURE The recording time for the entire night was 431.6 minutes.  During a baseline period of 158.3 minutes, the patient slept for 130.5 minutes in REM and nonREM, yielding a sleep efficiency of 82.4%. Sleep onset after lights out was 7.7 minutes with a REM latency of 95.0 minutes. The patient spent 15.71% of the night in stage N1 sleep, 70.88% in stage N2 sleep, 0.77% in stage N3 and 12.64% in REM.  During the titration period of 0.0 minutes, the patient slept for 0.0 minutes in REM and nonREM, yielding a sleep efficiency of N/A%. Sleep onset after CPAP initiation was N/A minutes with a REM latency of N/A minutes. The patient spent N/A% of the night in stage N1 sleep, N/A% in stage N2 sleep, N/A% in stage N3 and N/A% in REM.  CARDIAC DATA The 2 lead EKG demonstrated sinus rhythm. The mean heart rate was 59.13 beats per minute. Other EKG findings include: None. LEG MOVEMENT DATA The total Periodic Limb Movements of Sleep (PLMS) were 29. The  PLMS index was 5.41.  IMPRESSIONS - Moderate obstructive sleep apnea occurred during the diagnostic portion of the study(AHI = 17.0/hour). An optimal CPAP was selected for this patient ( 9 cm of water).   Delano Metz, MD Diplomate, American Board of Sleep Medicine.

## 2016-05-07 ENCOUNTER — Encounter: Payer: Self-pay | Admitting: Cardiology

## 2016-05-07 NOTE — Progress Notes (Signed)
Cardiology Office Note  Date: 05/08/2016   ID: Corey Bruce, DOB [birth year], MRN [id number]  PCP: Wende Neighbors, MD  Consulting Cardiologist: Rozann Lesches, MD   Chief Complaint  Patient presents with  . Cardiac evaluation  . Fatigue    History of Present Illness: Corey Bruce is a 66 y.o. male referred for cardiology consultation by Dr. Nevada Crane. He presents with a history of fatigue, not specifically exertional chest pain or unusual breathlessness. He states he was recently diagnosed with obstructive sleep apnea, has not yet started on CPAP, but this is planned. He also has a history of hypertension, although has not had to be on medications long-term, has been able to control blood pressure with weight loss and diet in most cases. He had left knee surgery back in October of last year, has been less active since that time and gained weight. He is now back on Cozaar. He has not been exercising as regularly.  Family history includes premature CAD in his father who had a heart attack in his 3s. He has no tobacco use history, no known diabetes mellitus. He is an LDL documented at 140, HDL 38. He has not undergone any prior cardiac ischemic testing. Recent ECG with Dr. [place] Crane was nonspecific.  Past Medical History:  Diagnosis Date  . 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 Essential hypertension   . Hyperlipidemia   . OSA (obstructive sleep apnea)   . Osteoarthritis     Past Surgical History:  Procedure Laterality Date  . BACK SURGERY     lower  . COLONOSCOPY  02/09/2012   Procedure: COLONOSCOPY;  Surgeon: Daneil Dolin, MD;  [facility];  Service: Endoscopy;  Laterality: N/A;  9:30 had to reschd due to ercp case called patient advised of new time 12:30 for 1:30 case patient stated okay/kr   . HEMORRHOID SURGERY    . PARTIAL KNEE ARTHROPLASTY Left 12/03/2015   Procedure: LEFT UNICOMPARTMENTAL KNEE;  Surgeon: Elsie Saas, MD;  Location: [facility];  Service: Orthopedics;  Laterality: Left;    Current Outpatient Prescriptions  Medication Sig Dispense Refill  . Multiple Vitamins-Minerals (CENTRUM SILVER 50+MEN PO) Take 1 tablet by mouth daily.    . vitamin C (ASCORBIC ACID) 500 MG tablet Take 500 mg by mouth daily. 2 tabs daily    . losartan (COZAAR) 50 MG tablet Take 50 mg by mouth daily.      No current facility-administered medications for this visit.    Allergies:  Erythromycin   Social History: The patient  reports that he has never smoked. He has never used smokeless tobacco. He reports that he does not drink alcohol or use drugs.   Family History: The patient's family history includes Heart attack in his father; Hypercholesterolemia in his mother; Hypertension in his father; Thyroid disease in his mother.   ROS:  Please see the history of present illness. Otherwise, complete review of systems is positive for improving left knee stiffness - states that he twisted it after surgery when he was recovering and has had to take a step back in terms of increasing his activity.  All other systems are reviewed and negative.   Physical Exam: VS:  BP 128/84   Pulse 82   Ht 6\' 1"  (1.854 m)   Wt 230 lb (104.3 kg)   SpO2 98%   BMI 30.34 kg/m , BMI Body mass index is 30.34 kg/m.  Wt Readings  from Last 3 Encounters:  05/08/16 230 lb (104.3 kg)  04/24/16 223 lb (101.2 kg)  12/03/15 224 lb (101.6 kg)    General: Overweight male, appears comfortable at rest. HEENT: Conjunctiva and lids normal, oropharynx clear. Neck: Supple, no elevated JVP or carotid bruits, no thyromegaly. Lungs: Clear to auscultation, nonlabored breathing at rest. Cardiac: Regular rate and rhythm, no S3 or significant systolic murmur, no pericardial rub. Abdomen: Soft, nontender, bowel sounds present, no guarding or rebound. Extremities: No pitting edema, distal pulses 2+. Skin: Warm and dry. Musculoskeletal: No kyphosis. Neuropsychiatric: Alert  and oriented x3, affect grossly appropriate.  ECG: No old tracing available for comparison.  Recent Labwork:  February 2018: Cholesterol 212, HDL 38, triglycerides 206, LDL 140, BUN 14, creatinine 0.8, potassium 4.4, AST 23, ALT 25, hemoglobin 15.8, platelets 205  Assessment and Plan:  1. Fatigue in a 66 year old overweight male with history of hypertension, mild hyperlipidemia, and family history of premature CAD. ECG is nonspecific. He has not undergone any prior ischemic testing and we will plan to arrange an exercise echocardiogram for further risk stratification.  2. Obstructive sleep apnea, recently diagnosed and planning to start on CPAP. This could also be a Patient for his fatigue.  3. Essential hypertension, now on Cozaar. Blood pressure reasonably controlled today.  Current medicines were reviewed with the patient today.   Orders Placed This Encounter  Procedures  . ECHOCARDIOGRAM STRESS TEST    Disposition: Call with test results.  Signed, Satira Sark, MD, [facility] 05/08/2016 10:34 AM    [facility] at [facility] [address] [place], [place] [address] Phone: [phone]; Fax: [phone]

## 2016-05-08 ENCOUNTER — Ambulatory Visit (INDEPENDENT_AMBULATORY_CARE_PROVIDER_SITE_OTHER): Payer: Medicare Other | Admitting: Cardiology

## 2016-05-08 ENCOUNTER — Encounter: Payer: Self-pay | Admitting: Cardiology

## 2016-05-08 VITALS — BP 128/84 | HR 82 | Ht 73.0 in | Wt 230.0 lb

## 2016-05-08 DIAGNOSIS — Z8249 Family history of ischemic heart disease and other diseases of the circulatory system: Secondary | ICD-10-CM | POA: Diagnosis not present

## 2016-05-08 DIAGNOSIS — R5383 Other fatigue: Secondary | ICD-10-CM

## 2016-05-08 DIAGNOSIS — G4733 Obstructive sleep apnea (adult) (pediatric): Secondary | ICD-10-CM | POA: Diagnosis not present

## 2016-05-08 DIAGNOSIS — I10 Essential (primary) hypertension: Secondary | ICD-10-CM

## 2016-05-08 DIAGNOSIS — E785 Hyperlipidemia, unspecified: Secondary | ICD-10-CM

## 2016-05-08 NOTE — Patient Instructions (Signed)
Your physician recommends that you schedule a follow-up appointment in:  To be determined after test. We will call you with results.   Your physician recommends that you continue on your current medications as directed. Please refer to the Current Medication list given to you today.    Your physician has requested that you have a stress echocardiogram. For further information please visit [email or web address]. Please follow instruction sheet as given.      Thank you for choosing [facility] !

## 2016-05-16 ENCOUNTER — Ambulatory Visit (HOSPITAL_COMMUNITY)
Admission: RE | Admit: 2016-05-16 | Discharge: 2016-05-16 | Disposition: A | Discharge status: Home / Self Care | Payer: Medicare Other | Source: Ambulatory Visit | Attending: Cardiology | Admitting: Cardiology

## 2016-05-16 DIAGNOSIS — Z8249 Family history of ischemic heart disease and other diseases of the circulatory system: Secondary | ICD-10-CM

## 2016-05-16 DIAGNOSIS — I493 Ventricular premature depolarization: Secondary | ICD-10-CM | POA: Insufficient documentation

## 2016-05-16 DIAGNOSIS — R5383 Other fatigue: Secondary | ICD-10-CM | POA: Diagnosis not present

## 2016-05-16 LAB — ECHOCARDIOGRAM STRESS TEST
CHL CUP MPHR: 155 {beats}/min
CHL CUP RESTING HR STRESS: 66 {beats}/min
CHL RATE OF PERCEIVED EXERTION: 15
CSEPEDS: 22 s
CSEPHR: 96 %
CSEPPHR: 150 {beats}/min
Estimated workload: 10.1 METS
Exercise duration (min): 8 min

## 2016-05-16 NOTE — Progress Notes (Signed)
*  PRELIMINARY RESULTS* Echocardiogram Echocardiogram Stress Test has been performed.  Leavy Cella 05/16/2016, 4:01 PM

## 2016-06-13 DIAGNOSIS — Z6830 Body mass index (BMI) 30.0-30.9, adult: Secondary | ICD-10-CM | POA: Diagnosis not present

## 2016-06-13 DIAGNOSIS — J302 Other seasonal allergic rhinitis: Secondary | ICD-10-CM | POA: Diagnosis not present

## 2016-06-13 DIAGNOSIS — G4733 Obstructive sleep apnea (adult) (pediatric): Secondary | ICD-10-CM | POA: Diagnosis not present

## 2016-06-20 DIAGNOSIS — J302 Other seasonal allergic rhinitis: Secondary | ICD-10-CM | POA: Diagnosis not present

## 2016-06-20 DIAGNOSIS — Z6830 Body mass index (BMI) 30.0-30.9, adult: Secondary | ICD-10-CM | POA: Diagnosis not present

## 2016-06-20 DIAGNOSIS — G4733 Obstructive sleep apnea (adult) (pediatric): Secondary | ICD-10-CM | POA: Diagnosis not present

## 2016-06-24 DIAGNOSIS — M1712 Unilateral primary osteoarthritis, left knee: Secondary | ICD-10-CM | POA: Diagnosis not present

## 2016-10-01 DIAGNOSIS — Z125 Encounter for screening for malignant neoplasm of prostate: Secondary | ICD-10-CM | POA: Diagnosis not present

## 2016-10-01 DIAGNOSIS — I10 Essential (primary) hypertension: Secondary | ICD-10-CM | POA: Diagnosis not present

## 2016-10-01 DIAGNOSIS — Z1159 Encounter for screening for other viral diseases: Secondary | ICD-10-CM | POA: Diagnosis not present

## 2016-10-03 DIAGNOSIS — Z6829 Body mass index (BMI) 29.0-29.9, adult: Secondary | ICD-10-CM | POA: Diagnosis not present

## 2016-10-03 DIAGNOSIS — E782 Mixed hyperlipidemia: Secondary | ICD-10-CM | POA: Diagnosis not present

## 2016-10-03 DIAGNOSIS — G4730 Sleep apnea, unspecified: Secondary | ICD-10-CM | POA: Diagnosis not present

## 2016-10-03 DIAGNOSIS — F439 Reaction to severe stress, unspecified: Secondary | ICD-10-CM | POA: Diagnosis not present

## 2016-10-03 DIAGNOSIS — E663 Overweight: Secondary | ICD-10-CM | POA: Diagnosis not present

## 2016-10-03 DIAGNOSIS — I10 Essential (primary) hypertension: Secondary | ICD-10-CM | POA: Diagnosis not present

## 2016-12-02 DIAGNOSIS — M1712 Unilateral primary osteoarthritis, left knee: Secondary | ICD-10-CM | POA: Diagnosis not present

## 2017-01-14 DIAGNOSIS — Z23 Encounter for immunization: Secondary | ICD-10-CM | POA: Diagnosis not present

## 2017-04-14 DIAGNOSIS — K2900 Acute gastritis without bleeding: Secondary | ICD-10-CM | POA: Diagnosis not present

## 2017-04-14 DIAGNOSIS — R42 Dizziness and giddiness: Secondary | ICD-10-CM | POA: Diagnosis not present

## 2017-04-14 DIAGNOSIS — G4733 Obstructive sleep apnea (adult) (pediatric): Secondary | ICD-10-CM | POA: Diagnosis not present

## 2017-04-14 DIAGNOSIS — I10 Essential (primary) hypertension: Secondary | ICD-10-CM | POA: Diagnosis not present

## 2017-04-14 DIAGNOSIS — Z23 Encounter for immunization: Secondary | ICD-10-CM | POA: Diagnosis not present

## 2017-04-14 DIAGNOSIS — G4710 Hypersomnia, unspecified: Secondary | ICD-10-CM | POA: Diagnosis not present

## 2017-04-14 DIAGNOSIS — E782 Mixed hyperlipidemia: Secondary | ICD-10-CM | POA: Diagnosis not present

## 2017-04-15 DIAGNOSIS — Z6830 Body mass index (BMI) 30.0-30.9, adult: Secondary | ICD-10-CM | POA: Diagnosis not present

## 2017-04-15 DIAGNOSIS — Z96652 Presence of left artificial knee joint: Secondary | ICD-10-CM | POA: Diagnosis not present

## 2017-04-15 DIAGNOSIS — G4733 Obstructive sleep apnea (adult) (pediatric): Secondary | ICD-10-CM | POA: Diagnosis not present

## 2017-04-15 DIAGNOSIS — E782 Mixed hyperlipidemia: Secondary | ICD-10-CM | POA: Diagnosis not present

## 2017-04-15 DIAGNOSIS — I10 Essential (primary) hypertension: Secondary | ICD-10-CM | POA: Diagnosis not present

## 2017-11-18 DIAGNOSIS — G4733 Obstructive sleep apnea (adult) (pediatric): Secondary | ICD-10-CM | POA: Diagnosis not present

## 2017-11-18 DIAGNOSIS — Z23 Encounter for immunization: Secondary | ICD-10-CM | POA: Diagnosis not present

## 2017-11-18 DIAGNOSIS — E782 Mixed hyperlipidemia: Secondary | ICD-10-CM | POA: Diagnosis not present

## 2017-11-18 DIAGNOSIS — Z96652 Presence of left artificial knee joint: Secondary | ICD-10-CM | POA: Diagnosis not present

## 2017-11-18 DIAGNOSIS — Z6830 Body mass index (BMI) 30.0-30.9, adult: Secondary | ICD-10-CM | POA: Diagnosis not present

## 2017-11-18 DIAGNOSIS — G4710 Hypersomnia, unspecified: Secondary | ICD-10-CM | POA: Diagnosis not present

## 2017-11-18 DIAGNOSIS — R42 Dizziness and giddiness: Secondary | ICD-10-CM | POA: Diagnosis not present

## 2017-11-18 DIAGNOSIS — I10 Essential (primary) hypertension: Secondary | ICD-10-CM | POA: Diagnosis not present

## 2017-11-18 DIAGNOSIS — K2900 Acute gastritis without bleeding: Secondary | ICD-10-CM | POA: Diagnosis not present

## 2017-11-23 DIAGNOSIS — Z6829 Body mass index (BMI) 29.0-29.9, adult: Secondary | ICD-10-CM | POA: Diagnosis not present

## 2017-11-23 DIAGNOSIS — I10 Essential (primary) hypertension: Secondary | ICD-10-CM | POA: Diagnosis not present

## 2017-11-23 DIAGNOSIS — Z0000 Encounter for general adult medical examination without abnormal findings: Secondary | ICD-10-CM | POA: Diagnosis not present

## 2017-11-23 DIAGNOSIS — Z23 Encounter for immunization: Secondary | ICD-10-CM | POA: Diagnosis not present

## 2017-12-04 DIAGNOSIS — J060 Acute laryngopharyngitis: Secondary | ICD-10-CM | POA: Diagnosis not present

## 2017-12-04 DIAGNOSIS — Z6829 Body mass index (BMI) 29.0-29.9, adult: Secondary | ICD-10-CM | POA: Diagnosis not present

## 2018-01-19 DIAGNOSIS — Z0000 Encounter for general adult medical examination without abnormal findings: Secondary | ICD-10-CM | POA: Diagnosis not present

## 2018-01-19 DIAGNOSIS — E782 Mixed hyperlipidemia: Secondary | ICD-10-CM | POA: Diagnosis not present

## 2018-01-19 DIAGNOSIS — Z23 Encounter for immunization: Secondary | ICD-10-CM | POA: Diagnosis not present

## 2018-01-19 DIAGNOSIS — R110 Nausea: Secondary | ICD-10-CM | POA: Diagnosis not present

## 2018-01-19 DIAGNOSIS — I10 Essential (primary) hypertension: Secondary | ICD-10-CM | POA: Diagnosis not present

## 2018-01-19 DIAGNOSIS — M791 Myalgia, unspecified site: Secondary | ICD-10-CM | POA: Diagnosis not present

## 2018-01-19 DIAGNOSIS — K2900 Acute gastritis without bleeding: Secondary | ICD-10-CM | POA: Diagnosis not present

## 2018-01-19 DIAGNOSIS — Z6829 Body mass index (BMI) 29.0-29.9, adult: Secondary | ICD-10-CM | POA: Diagnosis not present

## 2018-01-19 DIAGNOSIS — Z96652 Presence of left artificial knee joint: Secondary | ICD-10-CM | POA: Diagnosis not present

## 2018-01-19 DIAGNOSIS — G4710 Hypersomnia, unspecified: Secondary | ICD-10-CM | POA: Diagnosis not present

## 2018-01-19 DIAGNOSIS — Z6830 Body mass index (BMI) 30.0-30.9, adult: Secondary | ICD-10-CM | POA: Diagnosis not present

## 2018-01-19 DIAGNOSIS — G4733 Obstructive sleep apnea (adult) (pediatric): Secondary | ICD-10-CM | POA: Diagnosis not present

## 2018-01-19 DIAGNOSIS — J060 Acute laryngopharyngitis: Secondary | ICD-10-CM | POA: Diagnosis not present

## 2018-01-19 DIAGNOSIS — J069 Acute upper respiratory infection, unspecified: Secondary | ICD-10-CM | POA: Diagnosis not present

## 2018-01-19 DIAGNOSIS — R42 Dizziness and giddiness: Secondary | ICD-10-CM | POA: Diagnosis not present

## 2018-03-05 DIAGNOSIS — J019 Acute sinusitis, unspecified: Secondary | ICD-10-CM | POA: Diagnosis not present

## 2018-03-05 DIAGNOSIS — R062 Wheezing: Secondary | ICD-10-CM | POA: Diagnosis not present

## 2018-05-16 DIAGNOSIS — B349 Viral infection, unspecified: Secondary | ICD-10-CM | POA: Diagnosis not present

## 2018-05-16 DIAGNOSIS — J208 Acute bronchitis due to other specified organisms: Secondary | ICD-10-CM | POA: Diagnosis not present

## 2018-05-16 DIAGNOSIS — B9689 Other specified bacterial agents as the cause of diseases classified elsewhere: Secondary | ICD-10-CM | POA: Diagnosis not present

## 2018-06-04 DIAGNOSIS — I10 Essential (primary) hypertension: Secondary | ICD-10-CM | POA: Diagnosis not present

## 2018-09-30 ENCOUNTER — Other Ambulatory Visit: Payer: Self-pay

## 2018-11-10 DIAGNOSIS — Z23 Encounter for immunization: Secondary | ICD-10-CM | POA: Diagnosis not present

## 2019-01-12 DIAGNOSIS — I10 Essential (primary) hypertension: Secondary | ICD-10-CM | POA: Diagnosis not present

## 2019-01-12 DIAGNOSIS — Z6830 Body mass index (BMI) 30.0-30.9, adult: Secondary | ICD-10-CM | POA: Diagnosis not present

## 2019-01-12 DIAGNOSIS — E782 Mixed hyperlipidemia: Secondary | ICD-10-CM | POA: Diagnosis not present

## 2019-01-12 DIAGNOSIS — G4710 Hypersomnia, unspecified: Secondary | ICD-10-CM | POA: Diagnosis not present

## 2019-01-12 DIAGNOSIS — G4733 Obstructive sleep apnea (adult) (pediatric): Secondary | ICD-10-CM | POA: Diagnosis not present

## 2019-01-20 DIAGNOSIS — I10 Essential (primary) hypertension: Secondary | ICD-10-CM | POA: Diagnosis not present

## 2019-01-20 DIAGNOSIS — Z8249 Family history of ischemic heart disease and other diseases of the circulatory system: Secondary | ICD-10-CM | POA: Diagnosis not present

## 2019-01-20 DIAGNOSIS — Z0001 Encounter for general adult medical examination with abnormal findings: Secondary | ICD-10-CM | POA: Diagnosis not present

## 2019-01-20 DIAGNOSIS — E782 Mixed hyperlipidemia: Secondary | ICD-10-CM | POA: Diagnosis not present

## 2019-01-25 ENCOUNTER — Other Ambulatory Visit: Payer: Self-pay

## 2019-04-26 DIAGNOSIS — E7849 Other hyperlipidemia: Secondary | ICD-10-CM | POA: Diagnosis not present

## 2019-04-26 DIAGNOSIS — Z0001 Encounter for general adult medical examination with abnormal findings: Secondary | ICD-10-CM | POA: Diagnosis not present

## 2019-04-26 DIAGNOSIS — I10 Essential (primary) hypertension: Secondary | ICD-10-CM | POA: Diagnosis not present

## 2019-04-26 DIAGNOSIS — Z8249 Family history of ischemic heart disease and other diseases of the circulatory system: Secondary | ICD-10-CM | POA: Diagnosis not present

## 2019-04-26 DIAGNOSIS — E782 Mixed hyperlipidemia: Secondary | ICD-10-CM | POA: Diagnosis not present

## 2019-06-10 ENCOUNTER — Ambulatory Visit: Payer: Medicare Other | Attending: Internal Medicine

## 2019-06-10 DIAGNOSIS — Z23 Encounter for immunization: Secondary | ICD-10-CM

## 2019-06-10 NOTE — Progress Notes (Signed)
   Covid-19 Vaccination Clinic  Name:  Corey Bruce    MRN: [id number] DOB: 1950/03/14  [id number]  [name] was observed post Covid-19 immunization for 15 minutes without incident. He was provided with Vaccine Information Sheet and instruction to access the V-Safe system.   [name] was instructed to call 911 with any severe reactions post vaccine: Marland Kitchen Difficulty breathing  . Swelling of face and throat  . A fast heartbeat  . A bad rash all over body  . Dizziness and weakness   Immunizations Administered    Name Date Dose VIS Date Route   Moderna COVID-19 Vaccine 06/10/2019  9:17 AM 0.5 mL 02/01/2019 Intramuscular   Manufacturer: Levan Hurst   Lot: [id number]   [place]: [id number]      Covid-19 Vaccination Clinic  Name:  Corey Bruce    MRN: [id number] DOB: 1951/01/08  [id number]  [name] was observed post Covid-19 immunization for 15 minutes without incident. He was provided with Vaccine Information Sheet and instruction to access the V-Safe system.   [name] was instructed to call 911 with any severe reactions post vaccine: Marland Kitchen Difficulty breathing  . Swelling of face and throat  . A fast heartbeat  . A bad rash all over body  . Dizziness and weakness   Immunizations Administered    Name Date Dose VIS Date Route   Moderna COVID-19 Vaccine 06/10/2019  9:17 AM 0.5 mL 02/01/2019 Intramuscular   Manufacturer: Moderna   Lot: [id number]   [place][id number]

## 2019-06-23 DIAGNOSIS — S61231A Puncture wound without foreign body of left index finger without damage to nail, initial encounter: Secondary | ICD-10-CM | POA: Diagnosis not present

## 2019-06-23 DIAGNOSIS — Z23 Encounter for immunization: Secondary | ICD-10-CM | POA: Diagnosis not present

## 2019-07-12 ENCOUNTER — Ambulatory Visit: Payer: Medicare Other | Attending: Internal Medicine

## 2019-07-12 DIAGNOSIS — Z23 Encounter for immunization: Secondary | ICD-10-CM

## 2019-07-12 NOTE — Progress Notes (Signed)
   Covid-19 Vaccination Clinic  Name:  Corey Bruce    MRN: [id number] DOB: 1950/04/17  [id number]  [name] was observed post Covid-19 immunization for 15 minutes without incident. He was provided with Vaccine Information Sheet and instruction to access the V-Safe system.   [name] was instructed to call 911 with any severe reactions post vaccine: Marland Kitchen Difficulty breathing  . Swelling of face and throat  . A fast heartbeat  . A bad rash all over body  . Dizziness and weakness   Immunizations Administered    Name Date Dose VIS Date Route   Moderna COVID-19 Vaccine 07/12/2019  9:51 AM 0.5 mL 02/2019 Intramuscular   Manufacturer: Moderna   Lot: [id number]   [place][id number]

## 2019-09-27 DIAGNOSIS — E782 Mixed hyperlipidemia: Secondary | ICD-10-CM | POA: Diagnosis not present

## 2019-09-27 DIAGNOSIS — R110 Nausea: Secondary | ICD-10-CM | POA: Diagnosis not present

## 2019-09-27 DIAGNOSIS — E7849 Other hyperlipidemia: Secondary | ICD-10-CM | POA: Diagnosis not present

## 2019-09-27 DIAGNOSIS — I10 Essential (primary) hypertension: Secondary | ICD-10-CM | POA: Diagnosis not present

## 2019-09-27 DIAGNOSIS — Z6829 Body mass index (BMI) 29.0-29.9, adult: Secondary | ICD-10-CM | POA: Diagnosis not present

## 2019-09-27 DIAGNOSIS — G4733 Obstructive sleep apnea (adult) (pediatric): Secondary | ICD-10-CM | POA: Diagnosis not present

## 2019-09-27 DIAGNOSIS — J060 Acute laryngopharyngitis: Secondary | ICD-10-CM | POA: Diagnosis not present

## 2019-09-27 DIAGNOSIS — M791 Myalgia, unspecified site: Secondary | ICD-10-CM | POA: Diagnosis not present

## 2019-09-27 DIAGNOSIS — G4710 Hypersomnia, unspecified: Secondary | ICD-10-CM | POA: Diagnosis not present

## 2019-09-27 DIAGNOSIS — J069 Acute upper respiratory infection, unspecified: Secondary | ICD-10-CM | POA: Diagnosis not present

## 2019-09-27 DIAGNOSIS — K2900 Acute gastritis without bleeding: Secondary | ICD-10-CM | POA: Diagnosis not present

## 2019-09-27 DIAGNOSIS — R42 Dizziness and giddiness: Secondary | ICD-10-CM | POA: Diagnosis not present

## 2019-10-03 DIAGNOSIS — Z8249 Family history of ischemic heart disease and other diseases of the circulatory system: Secondary | ICD-10-CM | POA: Diagnosis not present

## 2019-10-03 DIAGNOSIS — E6609 Other obesity due to excess calories: Secondary | ICD-10-CM | POA: Diagnosis not present

## 2019-10-03 DIAGNOSIS — E782 Mixed hyperlipidemia: Secondary | ICD-10-CM | POA: Diagnosis not present

## 2019-10-03 DIAGNOSIS — I10 Essential (primary) hypertension: Secondary | ICD-10-CM | POA: Diagnosis not present

## 2019-11-25 DIAGNOSIS — H52223 Regular astigmatism, bilateral: Secondary | ICD-10-CM | POA: Diagnosis not present

## 2019-11-25 DIAGNOSIS — H43813 Vitreous degeneration, bilateral: Secondary | ICD-10-CM | POA: Diagnosis not present

## 2019-11-25 DIAGNOSIS — H5203 Hypermetropia, bilateral: Secondary | ICD-10-CM | POA: Diagnosis not present

## 2019-11-25 DIAGNOSIS — H2513 Age-related nuclear cataract, bilateral: Secondary | ICD-10-CM | POA: Diagnosis not present

## 2019-11-25 DIAGNOSIS — H524 Presbyopia: Secondary | ICD-10-CM | POA: Diagnosis not present

## 2019-12-02 ENCOUNTER — Encounter: Payer: Self-pay | Admitting: *Deleted

## 2019-12-02 ENCOUNTER — Telehealth: Payer: Self-pay | Admitting: Internal Medicine

## 2019-12-02 NOTE — Telephone Encounter (Signed)
Call to remind patient of upcoming appt 10/4 at 10:40a with Dr [facility] left on home machine.

## 2019-12-04 NOTE — Progress Notes (Signed)
Cardiology Office Note  Date: 12/05/2019   ID: Corey Bruce, DOB [birth year], MRN [id number]  PCP:  Celene Squibb, MD  Cardiologist:  Rozann Lesches, MD Electrophysiologist:  None   Chief Complaint  Patient presents with  . Cardiac evaluation    History of Present Illness: Corey Bruce is a 69 y.o. male referred for cardiology consultation by Dr. Nevada Crane with family history of premature CAD.  He does not describe any obvious angina.  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  He stays active, tries to get 10000 steps per day.  He has also lost 30 pounds through diet since March.  Feels much better.  Some shortness of breath with activity, but not necessarily inappropriate to level of exertion.  I personally reviewed his ECG today which shows normal sinus rhythm.  Family history includes premature CAD in his father who had a heart attack in his 74s, also 2 brothers within 37 years of his age.  He had a normal exercise echocardiogram in 2018 as noted below.  I reviewed his recent lab work from July, noted below.  His 10-year CVD risk estimation is 20%.  This does not take into account his family history of premature heart disease.  Past Medical History:  Diagnosis Date  . 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 Essential hypertension   . Hyperlipidemia   . OSA (obstructive sleep apnea)   . Osteoarthritis     Past Surgical History:  Procedure Laterality Date  . BACK SURGERY     lower  . COLONOSCOPY  02/09/2012   Procedure: COLONOSCOPY;  Surgeon: Daneil Dolin, MD;  [facility];  Service: Endoscopy;  Laterality: N/A;  9:30 had to reschd due to ercp case called patient advised of new time 12:30 for 1:30 case patient stated okay/kr   . HEMORRHOID SURGERY    . PARTIAL KNEE ARTHROPLASTY Left 12/03/2015   Procedure: LEFT UNICOMPARTMENTAL  KNEE;  Surgeon: Elsie Saas, MD;  Location: [facility];  Service: Orthopedics;  Laterality: Left;    Current Outpatient Medications  Medication Sig Dispense Refill  . atorvastatin (LIPITOR) 10 MG tablet Take 10 mg by mouth daily.    Marland Kitchen olmesartan (BENICAR) 20 MG tablet Take 20 mg by mouth daily.     No current facility-administered medications for this visit.   Allergies:  Erythromycin   Social History: The patient  reports that he has never smoked. He has never used smokeless tobacco. He reports that he does not drink alcohol and does not use drugs.   Family History: The patient's family history includes CAD in his brother and brother; Diabetes in an other family member; Heart attack in his father; Hypercholesterolemia in his mother; Hypertension in his father; Thyroid disease in his mother.   ROS:  No palpitations or syncope.  Physical Exam: VS:  BP 130/80   Pulse 68   Ht 6\' 1"  (1.854 m)   Wt 218 lb 6.4 oz (99.1 kg)   SpO2 98%   BMI 28.81 kg/m , BMI Body mass index is 28.81 kg/m.  Wt Readings from Last 3 Encounters:  12/05/19 218 lb 6.4 oz (99.1 kg)  05/08/16 230 lb (104.3 kg)  04/24/16 223 lb (101.2 kg)    General: Patient appears comfortable at rest. HEENT: Conjunctiva and lids normal, wearing  a mask. Neck: Supple, no elevated JVP or carotid bruits, no thyromegaly. Lungs: Clear to auscultation, nonlabored breathing at rest. Cardiac: Regular rate and rhythm, no S3 or significant systolic murmur, no pericardial rub. Abdomen: Soft, nontender, bowel sounds present. Extremities: No pitting edema, distal pulses 2+. Skin: Warm and dry. Musculoskeletal: No kyphosis. Neuropsychiatric: Alert and oriented x3, affect grossly appropriate.  ECG:  An ECG dated 04/04/2016 was personally reviewed today and demonstrated:  Sinus rhythm with decreased R wave progression.  Recent Labwork:  July 2021: Hemoglobin 14.5, platelets 173, BUN 16, creatinine 0.8, potassium 4.4,  AST 17, ALT 19, cholesterol 170, triglycerides 112, HDL 41, LDL 109  Other Studies Reviewed Today:  Exercise echocardiogram 05/16/2016: Study Conclusions   - Stress ECG conclusions: Frequent ventricular ectopy. Duke  scoring: exercise time of 8.5 min; maximum ST deviation of 0 mm;  no angina; resulting score is 9. This score predicts a low risk  of cardiac events.  - Staged echo: Resting LV systolic function was normal, LVEF  60-65%. With stress, there was a normal hypercontractile response  seen with augmentation of all wall segments, LVEF 75-80%. No  inducible ischemia noted. Normal echo stress   Impressions:   - Normal study after maximal exercise.   Assessment and Plan:  11.  69 year old male with a family history of premature CAD in two brothers and his father, personal history of hypertension and hyperlipidemia.  Recent lab work showed LDL 109 and HDL 41, he was started on low-dose Lipitor by Dr. Nevada Crane.  Baseline ECG is normal.  He has lost 30 pounds through diet, reports no angina, does have some dyspnea on exertion.  Last ischemic evaluation was in 2018.  We will proceed with an exercise Myoview.  2.  Mixed hyperlipidemia, LDL 109.  Would recommend a goal under 70 in light of his family history of premature CAD.  Keep follow-up with Dr. Nevada Crane, he may need a higher dose of Lipitor.  His weight loss and exercise should also be helpful.  3.  Essential hypertension, on Benicar.  Medication Adjustments/Labs and Tests Ordered: Current medicines are reviewed at length with the patient today.  Concerns regarding medicines are outlined above.   Tests Ordered: Orders Placed This Encounter  Procedures  . NM Myocar Multi W/Spect W/Wall Motion / EF  . EKG 12-Lead    Medication Changes: No orders of the defined types were placed in this encounter.   Disposition:  Follow up 1 year in the [place] office.  Signed, Satira Sark, MD, [facility] 12/05/2019 11:18 AM    [facility] [place], [place], [place] [address] Phone: [phone]; Fax: [phone]

## 2019-12-05 ENCOUNTER — Ambulatory Visit (INDEPENDENT_AMBULATORY_CARE_PROVIDER_SITE_OTHER): Payer: Medicare Other | Admitting: Cardiology

## 2019-12-05 ENCOUNTER — Telehealth: Payer: Self-pay | Admitting: Cardiology

## 2019-12-05 ENCOUNTER — Encounter: Payer: Self-pay | Admitting: Cardiology

## 2019-12-05 ENCOUNTER — Encounter: Payer: Self-pay | Admitting: *Deleted

## 2019-12-05 VITALS — BP 130/80 | HR 68 | Ht 73.0 in | Wt 218.4 lb

## 2019-12-05 DIAGNOSIS — Z8249 Family history of ischemic heart disease and other diseases of the circulatory system: Secondary | ICD-10-CM | POA: Diagnosis not present

## 2019-12-05 DIAGNOSIS — E782 Mixed hyperlipidemia: Secondary | ICD-10-CM

## 2019-12-05 DIAGNOSIS — R0600 Dyspnea, unspecified: Secondary | ICD-10-CM | POA: Diagnosis not present

## 2019-12-05 DIAGNOSIS — I10 Essential (primary) hypertension: Secondary | ICD-10-CM | POA: Diagnosis not present

## 2019-12-05 DIAGNOSIS — R0609 Other forms of dyspnea: Secondary | ICD-10-CM

## 2019-12-05 NOTE — Patient Instructions (Addendum)
Medication Instructions:   Your physician recommends that you continue on your current medications as directed. Please refer to the Current Medication list given to you today.  Labwork:  None  Testing/Procedures: Your physician has requested that you have en exercise stress myoview. For further information please visit [email or web address]. Please follow instruction sheet, as given.  Follow-Up:  Your physician recommends that you schedule a follow-up appointment in: 1 year. You will receive a reminder letter in the mail in about 10 months reminding you to call and schedule your appointment. If you don't receive this letter, please contact our office.  Any Other Special Instructions Will Be Listed Below (If Applicable).  If you need a refill on your cardiac medications before your next appointment, please call your pharmacy.

## 2019-12-05 NOTE — Telephone Encounter (Signed)
Pre-cert Verification for the following procedure    EXERCISE MYOVIEW   DATE:12/09/2019  LOCATION:   [facility]

## 2019-12-07 ENCOUNTER — Other Ambulatory Visit (HOSPITAL_COMMUNITY): Payer: Medicare Other

## 2019-12-09 ENCOUNTER — Encounter (HOSPITAL_COMMUNITY): Payer: Medicare Other

## 2019-12-14 DIAGNOSIS — E6609 Other obesity due to excess calories: Secondary | ICD-10-CM | POA: Diagnosis not present

## 2019-12-14 DIAGNOSIS — I10 Essential (primary) hypertension: Secondary | ICD-10-CM | POA: Diagnosis not present

## 2019-12-14 DIAGNOSIS — Z0000 Encounter for general adult medical examination without abnormal findings: Secondary | ICD-10-CM | POA: Diagnosis not present

## 2019-12-14 DIAGNOSIS — E782 Mixed hyperlipidemia: Secondary | ICD-10-CM | POA: Diagnosis not present

## 2019-12-14 DIAGNOSIS — Z8249 Family history of ischemic heart disease and other diseases of the circulatory system: Secondary | ICD-10-CM | POA: Diagnosis not present

## 2019-12-16 ENCOUNTER — Other Ambulatory Visit (HOSPITAL_COMMUNITY)
Admission: RE | Admit: 2019-12-16 | Discharge: 2019-12-16 | Disposition: A | Discharge status: Home / Self Care | Payer: Medicare Other | Source: Ambulatory Visit | Attending: Cardiology | Admitting: Cardiology

## 2019-12-16 ENCOUNTER — Other Ambulatory Visit: Payer: Self-pay

## 2019-12-16 DIAGNOSIS — Z01812 Encounter for preprocedural laboratory examination: Secondary | ICD-10-CM | POA: Diagnosis not present

## 2019-12-16 DIAGNOSIS — Z20822 Contact with and (suspected) exposure to covid-19: Secondary | ICD-10-CM | POA: Diagnosis not present

## 2019-12-17 LAB — SARS CORONAVIRUS 2 (TAT 6-24 HRS): SARS Coronavirus 2: NEGATIVE

## 2019-12-19 ENCOUNTER — Encounter (HOSPITAL_COMMUNITY): Payer: Medicare Other

## 2019-12-26 ENCOUNTER — Telehealth: Payer: Self-pay | Admitting: *Deleted

## 2019-12-26 NOTE — Telephone Encounter (Signed)
LM to return call - will need to repeat covid test prior to 10/28 stress test - scheduled for 10/27 @ 815am

## 2019-12-26 NOTE — Telephone Encounter (Signed)
-----   Message from Satira Sark, MD sent at 12/18/2019  4:03 PM EDT ----- Results reviewed.  Negative SARS coronavirus 2 test prior to stress testing.

## 2019-12-28 ENCOUNTER — Other Ambulatory Visit: Payer: Self-pay

## 2019-12-28 ENCOUNTER — Other Ambulatory Visit (HOSPITAL_COMMUNITY)
Admission: RE | Admit: 2019-12-28 | Discharge: 2019-12-28 | Disposition: A | Discharge status: Home / Self Care | Payer: Medicare Other | Source: Ambulatory Visit | Attending: Cardiology | Admitting: Cardiology

## 2019-12-28 DIAGNOSIS — Z01818 Encounter for other preprocedural examination: Secondary | ICD-10-CM | POA: Diagnosis not present

## 2019-12-28 DIAGNOSIS — Z20822 Contact with and (suspected) exposure to covid-19: Secondary | ICD-10-CM | POA: Diagnosis not present

## 2019-12-28 LAB — SARS CORONAVIRUS 2 (TAT 6-24 HRS): SARS Coronavirus 2: NEGATIVE

## 2019-12-29 ENCOUNTER — Encounter (HOSPITAL_COMMUNITY): Payer: Self-pay

## 2019-12-29 ENCOUNTER — Encounter (HOSPITAL_BASED_OUTPATIENT_CLINIC_OR_DEPARTMENT_OTHER)
Admission: RE | Admit: 2019-12-29 | Discharge: 2019-12-29 | Disposition: A | Discharge status: Home / Self Care | Payer: Medicare Other | Source: Ambulatory Visit | Attending: Cardiology | Admitting: Cardiology

## 2019-12-29 ENCOUNTER — Encounter (HOSPITAL_COMMUNITY)
Admission: RE | Admit: 2019-12-29 | Discharge: 2019-12-29 | Disposition: A | Discharge status: Home / Self Care | Payer: Medicare Other | Source: Ambulatory Visit | Attending: Cardiology | Admitting: Cardiology

## 2019-12-29 ENCOUNTER — Telehealth: Payer: Self-pay | Admitting: *Deleted

## 2019-12-29 DIAGNOSIS — R0600 Dyspnea, unspecified: Secondary | ICD-10-CM | POA: Diagnosis not present

## 2019-12-29 DIAGNOSIS — R0609 Other forms of dyspnea: Secondary | ICD-10-CM

## 2019-12-29 DIAGNOSIS — Z8249 Family history of ischemic heart disease and other diseases of the circulatory system: Secondary | ICD-10-CM | POA: Insufficient documentation

## 2019-12-29 DIAGNOSIS — I10 Essential (primary) hypertension: Secondary | ICD-10-CM

## 2019-12-29 LAB — NM MYOCAR MULTI W/SPECT W/WALL MOTION / EF
Estimated workload: 10.1 METS
Exercise duration (min): 7 min
Exercise duration (sec): 9 s
LV dias vol: 137 mL (ref 62–150)
LV sys vol: 57 mL
MPHR: 151 {beats}/min
Peak HR: 133 {beats}/min
Percent HR: 88 %
RATE: 0.34
RPE: 13
Rest HR: 58 {beats}/min
SDS: 0
SRS: 2
SSS: 2
TID: 0.99

## 2019-12-29 MED ORDER — SODIUM CHLORIDE FLUSH 0.9 % IV SOLN
INTRAVENOUS | Status: AC
  Administered 2019-12-29: 10 mL via INTRAVENOUS
  Filled 2019-12-29: qty 10

## 2019-12-29 MED ORDER — REGADENOSON 0.4 MG/5ML IV SOLN
INTRAVENOUS | Status: AC
  Filled 2019-12-29: qty 5

## 2019-12-29 MED ORDER — TECHNETIUM TC 99M TETROFOSMIN IV KIT
10.0000 | PACK | Freq: Once | INTRAVENOUS | Status: AC | PRN
  Administered 2019-12-29: 10.8 via INTRAVENOUS

## 2019-12-29 MED ORDER — TECHNETIUM TC 99M TETROFOSMIN IV KIT
30.0000 | PACK | Freq: Once | INTRAVENOUS | Status: AC | PRN
  Administered 2019-12-29: 32 via INTRAVENOUS

## 2019-12-29 NOTE — Telephone Encounter (Signed)
-----   Message from Satira Sark, MD sent at 12/29/2019 11:06 AM EDT ----- Results reviewed.  Screening exercise Myoview was abnormal - last ischemic assessment in 2018 was more reassuring.  Treadmill results indicate moderate CAD event risk by Duke treadmill score, although the perfusion imaging actually shows only a small ischemic territory in the basal inferolateral wall with normal ejection fraction of 58% and therefore suggests lower risk.  He did not report any major functional limitations at our recent office visit.  We can approach this in a few different ways.  If he is comfortable with observation and knowledge of the likelihood of underlying ischemic heart disease based on this stress testing, we could manage medically with aspirin 81 mg daily and his current course of Benicar and Lipitor.  If exertional symptoms develop in that case, a cardiac catheterization would be pursued.  On the other hand, since his screening stress testing is now abnormal and he has a known history of premature CAD in the family, we could also pursue a diagnostic cardiac catheterization now to clarify coronary anatomy and ensure no need for revascularization to further reduce his risk.  This could be scheduled as an outpatient.  Please see how he would like to proceed.

## 2019-12-30 NOTE — Telephone Encounter (Signed)
New message    Patient is returning call to [facility] to talk about stress test results

## 2019-12-30 NOTE — Telephone Encounter (Signed)
Pt voiced understanding - would like to think about this over the weekend and talk with his wife and will let us know Monday - will start ASA 81 ng daily

## 2020-01-02 ENCOUNTER — Other Ambulatory Visit: Payer: Self-pay | Admitting: Cardiology

## 2020-01-02 ENCOUNTER — Encounter: Payer: Self-pay | Admitting: *Deleted

## 2020-01-02 DIAGNOSIS — R9439 Abnormal result of other cardiovascular function study: Secondary | ICD-10-CM

## 2020-01-02 MED ORDER — ROSUVASTATIN CALCIUM 5 MG PO TABS: 5.0000 mg | ORAL_TABLET | ORAL | 0 refills | Status: DC

## 2020-01-02 MED ORDER — SODIUM CHLORIDE 0.9% FLUSH: 3.0000 mL | Freq: Two times a day (BID) | INTRAVENOUS | Status: DC

## 2020-01-02 NOTE — Progress Notes (Signed)
This is an addendum to office consultation on October 4.  Follow-up exercise Myoview was abnormal.  Duke treadmill score indicates moderate risk of CAD event with perfusion imaging showing a small ischemic territory in the basal inferolateral wall, LVEF 58%.  His previous ischemic evaluation in 2018 was negative.  He has a significant family history of premature CAD.  Diagnostic cardiac catheterization is being pursued now that the patient has had time to consider the situation and discuss it with his wife.  We will continue to optimize medical therapy which includes aspirin, Benicar, and Lipitor.  Main indication for cardiac catheterization is to exclude any higher risk anatomy that might require revascularization, otherwise medical therapy and observation would still make sense.

## 2020-01-02 NOTE — Telephone Encounter (Signed)
Thank you for the update.  Consultation note has been addended and orders are written.

## 2020-01-02 NOTE — Telephone Encounter (Signed)
Pt wants to go ahead with cardiac catheterization - scheduled for 11/5 with Dr Claiborne Billings @ 7:30am - pt aware of COVID test 11/3 @ 3pm and to have labs done same day at [facility] - will send instructions to MyChart as requested

## 2020-01-02 NOTE — Telephone Encounter (Signed)
New message     Patient returned call to [facility]

## 2020-01-03 DIAGNOSIS — R9439 Abnormal result of other cardiovascular function study: Secondary | ICD-10-CM | POA: Insufficient documentation

## 2020-01-03 DIAGNOSIS — Z1152 Encounter for screening for COVID-19: Secondary | ICD-10-CM | POA: Insufficient documentation

## 2020-01-04 ENCOUNTER — Other Ambulatory Visit: Payer: Self-pay

## 2020-01-04 ENCOUNTER — Telehealth: Payer: Self-pay | Admitting: *Deleted

## 2020-01-04 ENCOUNTER — Encounter (HOSPITAL_COMMUNITY)
Admission: RE | Admit: 2020-01-04 | Discharge: 2020-01-04 | Disposition: A | Discharge status: Home / Self Care | Payer: Medicare Other | Source: Ambulatory Visit | Attending: Cardiovascular Disease | Admitting: Cardiovascular Disease

## 2020-01-04 ENCOUNTER — Other Ambulatory Visit: Payer: Self-pay | Admitting: *Deleted

## 2020-01-04 DIAGNOSIS — R9439 Abnormal result of other cardiovascular function study: Secondary | ICD-10-CM

## 2020-01-04 DIAGNOSIS — Z1152 Encounter for screening for COVID-19: Secondary | ICD-10-CM | POA: Diagnosis not present

## 2020-01-04 LAB — CBC
HCT: 45.4 % (ref 39.0–52.0)
Hemoglobin: 15 g/dL (ref 13.0–17.0)
MCH: 30.5 pg (ref 26.0–34.0)
MCHC: 33 g/dL (ref 30.0–36.0)
MCV: 92.3 fL (ref 80.0–100.0)
Platelets: 183 10*3/uL (ref 150–400)
RBC: 4.92 MIL/uL (ref 4.22–5.81)
RDW: 13.2 % (ref 11.5–15.5)
WBC: 6.1 10*3/uL (ref 4.0–10.5)
nRBC: 0 % (ref 0.0–0.2)

## 2020-01-04 LAB — BASIC METABOLIC PANEL
Anion gap: 7 (ref 5–15)
BUN: 19 mg/dL (ref 8–23)
CO2: 25 mmol/L (ref 22–32)
Calcium: 9.1 mg/dL (ref 8.9–10.3)
Chloride: 106 mmol/L (ref 98–111)
Creatinine, Ser: 0.86 mg/dL (ref 0.61–1.24)
GFR, Estimated: >60 mL/min (ref >60)
Glucose, Bld: 125 mg/dL — ABNORMAL HIGH (ref 70–99)
Potassium: 3.6 mmol/L (ref 3.5–5.1)
Sodium: 138 mmol/L (ref 135–145)

## 2020-01-04 NOTE — Telephone Encounter (Signed)
Patient informed that virtual visit scheduled for tomorrow 01/05/20 @11 :30 am to update H&P for heart cath. Verbalized understanding of plan.    Patient Consent for Virtual Visit         Corey Bruce has provided verbal consent on 01/04/2020 for a virtual visit (video or telephone).   CONSENT FOR VIRTUAL VISIT FOR:  Corey Bruce  By participating in this virtual visit I agree to the following:  I hereby voluntarily request, consent and authorize [facility] and its employed or contracted physicians, physician assistants, nurse practitioners or other licensed health care professionals (the Practitioner), to provide me with telemedicine health care services (the "Services") as deemed necessary by the treating Practitioner. I acknowledge and consent to receive the Services by the Practitioner via telemedicine. I understand that the telemedicine visit will involve communicating with the Practitioner through live audiovisual communication technology and the disclosure of certain medical information by electronic transmission. I acknowledge that I have been given the opportunity to request an in-person assessment or other available alternative prior to the telemedicine visit and am voluntarily participating in the telemedicine visit.  I understand that I have the right to withhold or withdraw my consent to the use of telemedicine in the course of my care at any time, without affecting my right to future care or treatment, and that the Practitioner or I may terminate the telemedicine visit at any time. I understand that I have the right to inspect all information obtained and/or recorded in the course of the telemedicine visit and may receive copies of available information for a reasonable fee.  I understand that some of the potential risks of receiving the Services via telemedicine include:  Marland Kitchen Delay or interruption in medical evaluation due to technological equipment failure or  disruption; . Information transmitted may not be sufficient (e.g. poor resolution of images) to allow for appropriate medical decision making by the Practitioner; and/or  . In rare instances, security protocols could fail, causing a breach of personal health information.  Furthermore, I acknowledge that it is my responsibility to provide information about my medical history, conditions and care that is complete and accurate to the best of my ability. I acknowledge that Practitioner's advice, recommendations, and/or decision may be based on factors not within their control, such as incomplete or inaccurate data provided by me or distortions of diagnostic images or specimens that may result from electronic transmissions. I understand that the practice of medicine is not an exact science and that Practitioner makes no warranties or guarantees regarding treatment outcomes. I acknowledge that a copy of this consent can be made available to me via my patient portal ([facility]), or I can request a printed copy by calling the office of [facility].    I understand that my insurance will be billed for this visit.   I have read or had this consent read to me. . I understand the contents of this consent, which adequately explains the benefits and risks of the Services being provided via telemedicine.  . I have been provided ample opportunity to ask questions regarding this consent and the Services and have had my questions answered to my satisfaction. . I give my informed consent for the services to be provided through the use of telemedicine in my medical care

## 2020-01-04 NOTE — Progress Notes (Signed)
Virtual Visit via Telephone Note   This visit type was conducted due to national recommendations for restrictions regarding the COVID-19 Pandemic (e.g. social distancing) in an effort to limit this patient's exposure and mitigate transmission in our community.  Due to his co-morbid illnesses, this patient is at least at moderate risk for complications without adequate follow up.  This format is felt to be most appropriate for this patient at this time.  The patient did not have access to video technology/had technical difficulties with video requiring transitioning to audio format only (telephone).  All issues noted in this document were discussed and addressed.  No physical exam could be performed with this format.  Please refer to the patient's chart for his  consent to telehealth for [facility].    Date:  01/05/2020   ID:  Corey Bruce, DOB [birth year], MRN [id number] The patient was identified using 2 identifiers.  Patient Location: Home Provider Location: Home Office  PCP:  Celene Squibb, MD  Cardiologist:  Rozann Lesches, MD  Electrophysiologist:  None   Evaluation Performed:  Follow-Up Visit  Chief Complaint:  Cardiac Clearance cardiac catheterization  History of Present Illness:    Corey Bruce is a 69 y.o. male with history of essential hypertension, hyperlipidemia, OSA, family history of premature CAD in father with MI in his 85s.  Last encounter 12/05/2019 with Dr. Domenic Polite.  He was referred by his primary care provider for family history of premature CAD.  He did not describe any obvious anginal symptoms.  He has been active and has lost weight recently approximately 30 pounds.  His 10-year CVD risk estimation was 20%.  Had a recent abnormal stress test and was given the option of proceeding with medical therapy or choosing to have an elective diagnostic cardiac catheterization.  Patient agreed to undergo  cardiac catheterization due to family history and risk  factors in addition to abnormal stress test.  He was to continue Lipitor.  He was advised he may need a higher dose and goal should be LDL less than 70 in light of family history of premature CAD.  He was continue Benicar.     Spoke with patient regarding pending cardiac catheterization and he is aware of all the risks and benefits of undergoing the procedure.  He states he and Dr. Domenic Polite discussed at length on previous visit thoroughly the risk and benefits of having a cardiac catheterization.  He is aware and verbalizes understanding.  He denies any recent issues.  States he has had his Covid test and lab work and is scheduled to be at the hospital at 5:30 AM tomorrow morning..  Currently denies any anginal or exertional symptoms.   The patient does not have symptoms concerning for COVID-19 infection (fever, chills, cough, or new shortness of breath).    Past Medical History:  Diagnosis Date  . 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 Essential hypertension   . Hyperlipidemia   . OSA (obstructive sleep apnea)   . Osteoarthritis    Past Surgical History:  Procedure Laterality Date  . BACK SURGERY     lower  . COLONOSCOPY  02/09/2012   Procedure: COLONOSCOPY;  Surgeon: Daneil Dolin, MD;  [facility];  Service: Endoscopy;  Laterality: N/A;  9:30 had to reschd due to ercp case called patient advised of new time 12:30 for 1:30 case patient stated okay/kr   . HEMORRHOID SURGERY    . PARTIAL KNEE  ARTHROPLASTY Left 12/03/2015   Procedure: LEFT UNICOMPARTMENTAL KNEE;  Surgeon: Elsie Saas, MD;  Location: [facility];  Service: Orthopedics;  Laterality: Left;     Current Meds  Medication Sig  . aspirin EC 81 MG tablet Take 81 mg by mouth daily. Swallow whole.   Marland Kitchen atorvastatin (LIPITOR) 10 MG tablet Take 10 mg by mouth daily.  Marland Kitchen olmesartan (BENICAR) 20 MG tablet Take 20 mg by mouth daily.   Current Facility-Administered Medications for the 01/05/20  encounter (Telemedicine) with Verta Ellen., NP  Medication  . sodium chloride flush (NS) 0.9 % injection 3 mL     Allergies:   Erythromycin   Social History   Tobacco Use  . Smoking status: Never Smoker  . Smokeless tobacco: Never Used  . Tobacco comment: Smoked only about a year as teenager  Substance Use Topics  . Alcohol use: No    Comment: remote etoh abuse over 26 years ago  . Drug use: No     Family Hx: The patient's family history includes CAD in his brother and brother; Diabetes in an other family member; Heart attack in his father; Hypercholesterolemia in his mother; Hypertension in his father; Thyroid disease in his mother. There is no history of Colon cancer.  ROS:   Please see the history of present illness.    All other systems reviewed and are negative.   Prior CV studies:   The following studies were reviewed today:  Exercise Cardiolite Myovue treadmill stress test 12/29/2019 Study Result  Narrative & Impression   Abnormal ST segment changes suggestive of ischemia, approximately 1 mm upsloping ST segment depression in leads II, III, aVF, and V5 through V6 occurring in stage II-III and in the absence of chest pain. PVCs and ventricular couplets also developed during exercise, waning in recovery. There was a hypertensive response noted. Patient achieved a maximum workload of 10.1 METS. No sustained arrhythmias. Moderate risk Duke treadmill score of 2.  Small, mild intensity, basal inferolateral defect that is partially reversible and suggestive of a small ischemic territory. Diaphragmatic attenuation also noted.  This is a low risk study based on perfusion images.  Nuclear stress EF: 58%. TID ratio normal.       Exercise echocardiogram 05/16/2016: Study Conclusions   - Stress ECG conclusions: Frequent ventricular ectopy. Duke  scoring: exercise time of 8.5 min; maximum ST deviation of 0 mm;  no angina; resulting score is 9. This score predicts  a low risk  of cardiac events.  - Staged echo: Resting LV systolic function was normal, LVEF  60-65%. With stress, there was a normal hypercontractile response  seen with augmentation of all wall segments, LVEF 75-80%. No  inducible ischemia noted. Normal echo stress   Impressions:   - Normal study after maximal exercise.    Labs/Other Tests and Data Reviewed:    EKG:  EKG December 05, 2019 normal sinus rhythm rate of 67  Recent Labs: 01/04/2020: BUN 19; Creatinine, Ser 0.86; Hemoglobin 15.0; Platelets 183; Potassium 3.6; Sodium 138   Recent Lipid Panel No results found for: CHOL, TRIG, HDL, CHOLHDL, LDLCALC, LDLDIRECT  Wt Readings from Last 3 Encounters:  01/05/20 216 lb (98 kg)  12/05/19 218 lb 6.4 oz (99.1 kg)  05/08/16 230 lb (104.3 kg)     Risk Assessment/Calculations:       Objective:    Vital Signs:  BP 112/73   Pulse 62   Ht 6\' 2"  (1.88 m)   Wt 216 lb (  98 kg)   BMI 27.73 kg/m    VITAL SIGNS:  reviewed  ASSESSMENT & PLAN:    1. Abnormal stress test Recent abnormal stress test and strong family history of CAD in his father and both brothers.  Cardiac catheterization and risk and benefits have been thoroughly discussed between patient and Dr. Domenic Polite at prior visit.  Patient fully understands risk and benefits of undergoing cardiac catheterization.  Cardiac catheterization is scheduled for tomorrow a.m.  Continue aspirin 81 mg daily.  RCRI score 0.  Placing patient at perioperative risk of major cardiac event at 0.4%.  2. Mixed hyperlipidemia Continue atorvastatin 10 mg daily.  3. Essential hypertension Blood pressures well controlled today.  Continue Benicar 20 mg p.o. daily  4. Family history of premature CAD Family history of early heart disease in his father around age 98.  He has 2 brothers who have had MIs with stent placement.  COVID-19 Education: The signs and symptoms of COVID-19 were discussed with the patient and how to seek care for  testing (follow up with PCP or arrange E-visit).  The importance of social distancing was discussed today.  Time:   Today, I have spent 10 minutes with the patient with telehealth technology discussing the above problems.     Medication Adjustments/Labs and Tests Ordered: Current medicines are reviewed at length with the patient today.  Concerns regarding medicines are outlined above.   Tests Ordered: No orders of the defined types were placed in this encounter.   Medication Changes: No orders of the defined types were placed in this encounter.   Follow Up:  In Person in 4 week(s) with Dr. Domenic Polite or APP after cardiac catheterization scheduled for tomorrow  Signed, Einar Grad Jonell Cluck, NP  01/05/2020 11:01 AM    [place]

## 2020-01-04 NOTE — H&P (View-Only) (Signed)
Virtual Visit via Telephone Note   This visit type was conducted due to national recommendations for restrictions regarding the COVID-19 Pandemic (e.g. social distancing) in an effort to limit this patient's exposure and mitigate transmission in our community.  Due to his co-morbid illnesses, this patient is at least at moderate risk for complications without adequate follow up.  This format is felt to be most appropriate for this patient at this time.  The patient did not have access to video technology/had technical difficulties with video requiring transitioning to audio format only (telephone).  All issues noted in this document were discussed and addressed.  No physical exam could be performed with this format.  Please refer to the patient's chart for his  consent to telehealth for [name].    Date:  01/05/2020   ID:  Corey Bruce, DOB [birth year], MRN [id number] The patient was identified using 2 identifiers.  Patient Location: Home Provider Location: Home Office  PCP:  Celene Squibb, MD  Cardiologist:  Rozann Lesches, MD  Electrophysiologist:  None   Evaluation Performed:  Follow-Up Visit  Chief Complaint:  Cardiac Clearance cardiac catheterization  History of Present Illness:    Corey Bruce is a 69 y.o. male with history of essential hypertension, hyperlipidemia, OSA, family history of premature CAD in father with MI in his 9s.  Last encounter 12/05/2019 with Dr. Domenic Polite.  He was referred by his primary care provider for family history of premature CAD.  He did not describe any obvious anginal symptoms.  He has been active and has lost weight recently approximately 30 pounds.  His 10-year CVD risk estimation was 20%.  Had a recent abnormal stress test and was given the option of proceeding with medical therapy or choosing to have an elective diagnostic cardiac catheterization.  Patient agreed to undergo  cardiac catheterization due to family history and risk  factors in addition to abnormal stress test.  He was to continue Lipitor.  He was advised he may need a higher dose and goal should be LDL less than 70 in light of family history of premature CAD.  He was continue Benicar.     Spoke with patient regarding pending cardiac catheterization and he is aware of all the risks and benefits of undergoing the procedure.  He states he and Dr. Domenic Polite discussed at length on previous visit thoroughly the risk and benefits of having a cardiac catheterization.  He is aware and verbalizes understanding.  He denies any recent issues.  States he has had his Covid test and lab work and is scheduled to be at the hospital at 5:30 AM tomorrow morning..  Currently denies any anginal or exertional symptoms.   The patient does not have symptoms concerning for COVID-19 infection (fever, chills, cough, or new shortness of breath).    Past Medical History:  Diagnosis Date  . 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 Essential hypertension   . Hyperlipidemia   . OSA (obstructive sleep apnea)   . Osteoarthritis    Past Surgical History:  Procedure Laterality Date  . BACK SURGERY     lower  . COLONOSCOPY  02/09/2012   Procedure: COLONOSCOPY;  Surgeon: Daneil Dolin, MD;  [facility];  Service: Endoscopy;  Laterality: N/A;  9:30 had to reschd due to ercp case called patient advised of new time 12:30 for 1:30 case patient stated okay/kr   . HEMORRHOID SURGERY    . PARTIAL KNEE  ARTHROPLASTY Left 12/03/2015   Procedure: LEFT UNICOMPARTMENTAL KNEE;  Surgeon: Elsie Saas, MD;  Location: [facility];  Service: Orthopedics;  Laterality: Left;     Current Meds  Medication Sig  . aspirin EC 81 MG tablet Take 81 mg by mouth daily. Swallow whole.   Marland Kitchen atorvastatin (LIPITOR) 10 MG tablet Take 10 mg by mouth daily.  Marland Kitchen olmesartan (BENICAR) 20 MG tablet Take 20 mg by mouth daily.   Current Facility-Administered Medications for the 01/05/20  encounter (Telemedicine) with Verta Ellen., NP  Medication  . sodium chloride flush (NS) 0.9 % injection 3 mL     Allergies:   Erythromycin   Social History   Tobacco Use  . Smoking status: Never Smoker  . Smokeless tobacco: Never Used  . Tobacco comment: Smoked only about a year as teenager  Substance Use Topics  . Alcohol use: No    Comment: remote etoh abuse over 26 years ago  . Drug use: No     Family Hx: The patient's family history includes CAD in his brother and brother; Diabetes in an other family member; Heart attack in his father; Hypercholesterolemia in his mother; Hypertension in his father; Thyroid disease in his mother. There is no history of Colon cancer.  ROS:   Please see the history of present illness.    All other systems reviewed and are negative.   Prior CV studies:   The following studies were reviewed today:  Exercise Cardiolite Myovue treadmill stress test 12/29/2019 Study Result  Narrative & Impression   Abnormal ST segment changes suggestive of ischemia, approximately 1 mm upsloping ST segment depression in leads II, III, aVF, and V5 through V6 occurring in stage II-III and in the absence of chest pain. PVCs and ventricular couplets also developed during exercise, waning in recovery. There was a hypertensive response noted. Patient achieved a maximum workload of 10.1 METS. No sustained arrhythmias. Moderate risk Duke treadmill score of 2.  Small, mild intensity, basal inferolateral defect that is partially reversible and suggestive of a small ischemic territory. Diaphragmatic attenuation also noted.  This is a low risk study based on perfusion images.  Nuclear stress EF: 58%. TID ratio normal.       Exercise echocardiogram 05/16/2016: Study Conclusions   - Stress ECG conclusions: Frequent ventricular ectopy. Duke  scoring: exercise time of 8.5 min; maximum ST deviation of 0 mm;  no angina; resulting score is 9. This score predicts  a low risk  of cardiac events.  - Staged echo: Resting LV systolic function was normal, LVEF  60-65%. With stress, there was a normal hypercontractile response  seen with augmentation of all wall segments, LVEF 75-80%. No  inducible ischemia noted. Normal echo stress   Impressions:   - Normal study after maximal exercise.    Labs/Other Tests and Data Reviewed:    EKG:  EKG December 05, 2019 normal sinus rhythm rate of 67  Recent Labs: 01/04/2020: BUN 19; Creatinine, Ser 0.86; Hemoglobin 15.0; Platelets 183; Potassium 3.6; Sodium 138   Recent Lipid Panel No results found for: CHOL, TRIG, HDL, CHOLHDL, LDLCALC, LDLDIRECT  Wt Readings from Last 3 Encounters:  01/05/20 216 lb (98 kg)  12/05/19 218 lb 6.4 oz (99.1 kg)  05/08/16 230 lb (104.3 kg)     Risk Assessment/Calculations:       Objective:    Vital Signs:  BP 112/73   Pulse 62   Ht 6\' 2"  (1.88 m)   Wt 216 lb (  98 kg)   BMI 27.73 kg/m    VITAL SIGNS:  reviewed  ASSESSMENT & PLAN:    1. Abnormal stress test Recent abnormal stress test and strong family history of CAD in his father and both brothers.  Cardiac catheterization and risk and benefits have been thoroughly discussed between patient and Dr. Domenic Polite at prior visit.  Patient fully understands risk and benefits of undergoing cardiac catheterization.  Cardiac catheterization is scheduled for tomorrow a.m.  Continue aspirin 81 mg daily.  RCRI score 0.  Placing patient at perioperative risk of major cardiac event at 0.4%.  2. Mixed hyperlipidemia Continue atorvastatin 10 mg daily.  3. Essential hypertension Blood pressures well controlled today.  Continue Benicar 20 mg p.o. daily  4. Family history of premature CAD Family history of early heart disease in his father around age 36.  He has 2 brothers who have had MIs with stent placement.  COVID-19 Education: The signs and symptoms of COVID-19 were discussed with the patient and how to seek care for  testing (follow up with PCP or arrange E-visit).  The importance of social distancing was discussed today.  Time:   Today, I have spent 10 minutes with the patient with telehealth technology discussing the above problems.     Medication Adjustments/Labs and Tests Ordered: Current medicines are reviewed at length with the patient today.  Concerns regarding medicines are outlined above.   Tests Ordered: No orders of the defined types were placed in this encounter.   Medication Changes: No orders of the defined types were placed in this encounter.   Follow Up:  In Person in 4 week(s) with Dr. Domenic Polite or APP after cardiac catheterization scheduled for tomorrow  Signed, Einar Grad Jonell Cluck, NP  01/05/2020 11:01 AM    [place]

## 2020-01-05 ENCOUNTER — Telehealth (INDEPENDENT_AMBULATORY_CARE_PROVIDER_SITE_OTHER): Payer: Medicare Other | Admitting: Family Medicine

## 2020-01-05 ENCOUNTER — Telehealth: Payer: Self-pay | Admitting: *Deleted

## 2020-01-05 ENCOUNTER — Encounter: Payer: Self-pay | Admitting: Family Medicine

## 2020-01-05 ENCOUNTER — Telehealth: Payer: Self-pay | Admitting: Family Medicine

## 2020-01-05 VITALS — BP 112/73 | HR 62 | Ht 74.0 in | Wt 216.0 lb

## 2020-01-05 DIAGNOSIS — R9439 Abnormal result of other cardiovascular function study: Secondary | ICD-10-CM | POA: Diagnosis not present

## 2020-01-05 DIAGNOSIS — I10 Essential (primary) hypertension: Secondary | ICD-10-CM

## 2020-01-05 DIAGNOSIS — Z8249 Family history of ischemic heart disease and other diseases of the circulatory system: Secondary | ICD-10-CM

## 2020-01-05 DIAGNOSIS — E782 Mixed hyperlipidemia: Secondary | ICD-10-CM | POA: Diagnosis not present

## 2020-01-05 LAB — SARS CORONAVIRUS 2 (TAT 6-24 HRS): SARS Coronavirus 2: NEGATIVE

## 2020-01-05 NOTE — Patient Instructions (Signed)
Medication Instructions:  Continue all current medications.  Labwork: none  Testing/Procedures: none  Follow-Up: 1 month   Any Other Special Instructions Will Be Listed Below (If Applicable).  If you need a refill on your cardiac medications before your next appointment, please call your pharmacy.  

## 2020-01-05 NOTE — Telephone Encounter (Signed)
Pre-cert Verification for the following procedure    CARDIAC CATHETERIZATION  By Dr. Claiborne Billings  DATE:  01/06/2020  LOCATION: [facility]

## 2020-01-05 NOTE — Telephone Encounter (Signed)
Pt contacted pre-catheterization scheduled at [facility] for: Friday January 06, 2020 7:30 AM Verified arrival time and place: [facility]) at: 5:30 AM   No solid food after midnight prior to cath, clear liquids until 5 AM day of procedure.   AM meds can be  taken pre-cath with sips of water including: ASA 81 mg   Confirmed patient has responsible adult to drive home post procedure and be with patient first 24 hours after arriving home: yes  You are allowed ONE visitor in the waiting room during the time you are at the hospital for your procedure. Both you and your visitor must wear a mask once you enter the hospital.       COVID-19 Pre-Screening Questions:   In the past 14 days have you had a new cough, new headache, new nasal congestion, fever (100.4 or greater) unexplained body aches, new sore throat, or sudden loss of taste or sense of smell? no             In the past 14 days have you been around anyone with known Covid 19? no   Reviewed procedure/mask/visitor instructions, COVID-19 questions with patient.

## 2020-01-06 ENCOUNTER — Encounter (HOSPITAL_COMMUNITY): Payer: Self-pay | Admitting: Cardiovascular Disease

## 2020-01-06 ENCOUNTER — Ambulatory Visit (HOSPITAL_COMMUNITY)
Admission: RE | Admit: 2020-01-06 | Discharge: 2020-01-06 | Disposition: A | Discharge status: Home / Self Care | Payer: Medicare Other | Source: Ambulatory Visit | Attending: Cardiovascular Disease | Admitting: Cardiovascular Disease

## 2020-01-06 ENCOUNTER — Encounter (HOSPITAL_COMMUNITY)
Admission: RE | Disposition: A | Discharge status: Home / Self Care | Payer: Self-pay | Source: Ambulatory Visit | Attending: Cardiovascular Disease

## 2020-01-06 ENCOUNTER — Other Ambulatory Visit: Payer: Self-pay

## 2020-01-06 DIAGNOSIS — E782 Mixed hyperlipidemia: Secondary | ICD-10-CM | POA: Diagnosis not present

## 2020-01-06 DIAGNOSIS — I10 Essential (primary) hypertension: Secondary | ICD-10-CM | POA: Diagnosis not present

## 2020-01-06 DIAGNOSIS — Z881 Allergy status to other antibiotic agents status: Secondary | ICD-10-CM | POA: Insufficient documentation

## 2020-01-06 DIAGNOSIS — R9430 Abnormal result of cardiovascular function study, unspecified: Secondary | ICD-10-CM | POA: Diagnosis present

## 2020-01-06 DIAGNOSIS — Z8249 Family history of ischemic heart disease and other diseases of the circulatory system: Secondary | ICD-10-CM | POA: Diagnosis not present

## 2020-01-06 DIAGNOSIS — Z7982 Long term (current) use of aspirin: Secondary | ICD-10-CM | POA: Insufficient documentation

## 2020-01-06 DIAGNOSIS — G4733 Obstructive sleep apnea (adult) (pediatric): Secondary | ICD-10-CM | POA: Diagnosis not present

## 2020-01-06 DIAGNOSIS — I2510 Atherosclerotic heart disease of native coronary artery without angina pectoris: Secondary | ICD-10-CM | POA: Insufficient documentation

## 2020-01-06 DIAGNOSIS — R9439 Abnormal result of other cardiovascular function study: Secondary | ICD-10-CM

## 2020-01-06 HISTORY — PX: LEFT HEART CATH AND CORONARY ANGIOGRAPHY: CATH118249

## 2020-01-06 SURGERY — LEFT HEART CATH AND CORONARY ANGIOGRAPHY
Anesthesia: LOCAL

## 2020-01-06 MED ORDER — ASPIRIN 81 MG PO CHEW: 81.0000 mg | CHEWABLE_TABLET | ORAL | Status: DC

## 2020-01-06 MED ORDER — METOPROLOL SUCCINATE ER 25 MG PO TB24: 25.0000 mg | ORAL_TABLET | Freq: Every day | ORAL | Status: DC

## 2020-01-06 MED ORDER — NITROGLYCERIN 0.2 MG/ML ON CALL CATH LAB
INTRAVENOUS | Status: AC
  Filled 2020-01-06: qty 1

## 2020-01-06 MED ORDER — NITROGLYCERIN 1 MG/10 ML FOR IR/CATH LAB
INTRA_ARTERIAL | Status: DC | PRN
  Administered 2020-01-06: 200 ug via INTRACORONARY

## 2020-01-06 MED ORDER — NITROGLYCERIN 1 MG/10 ML FOR IR/CATH LAB
INTRA_ARTERIAL | Status: AC
  Filled 2020-01-06: qty 10

## 2020-01-06 MED ORDER — HEPARIN SODIUM (PORCINE) 1000 UNIT/ML IJ SOLN
INTRAMUSCULAR | Status: AC
  Filled 2020-01-06: qty 1

## 2020-01-06 MED ORDER — MIDAZOLAM HCL 2 MG/2ML IJ SOLN
INTRAMUSCULAR | Status: AC
  Filled 2020-01-06: qty 2

## 2020-01-06 MED ORDER — SODIUM CHLORIDE 0.9% FLUSH: 3.0000 mL | INTRAVENOUS | Status: DC | PRN

## 2020-01-06 MED ORDER — SODIUM CHLORIDE 0.9 % WEIGHT BASED INFUSION
3.0000 mL/kg/h | INTRAVENOUS | Status: AC
  Administered 2020-01-06: 3 mL/kg/h via INTRAVENOUS

## 2020-01-06 MED ORDER — VERAPAMIL HCL 2.5 MG/ML IV SOLN
INTRAVENOUS | Status: DC | PRN
  Administered 2020-01-06: 10 mL via INTRA_ARTERIAL

## 2020-01-06 MED ORDER — HEPARIN SODIUM (PORCINE) 1000 UNIT/ML IJ SOLN
INTRAMUSCULAR | Status: DC | PRN
  Administered 2020-01-06: 5000 [IU] via INTRAVENOUS

## 2020-01-06 MED ORDER — SODIUM CHLORIDE 0.9 % IV SOLN: 250.0000 mL | INTRAVENOUS | Status: DC | PRN

## 2020-01-06 MED ORDER — LIDOCAINE HCL (PF) 1 % IJ SOLN
INTRAMUSCULAR | Status: DC | PRN
  Administered 2020-01-06: 2 mL

## 2020-01-06 MED ORDER — HEPARIN (PORCINE) IN NACL 1000-0.9 UT/500ML-% IV SOLN
INTRAVENOUS | Status: DC | PRN
  Administered 2020-01-06 (×2): 500 mL

## 2020-01-06 MED ORDER — HEPARIN (PORCINE) IN NACL 1000-0.9 UT/500ML-% IV SOLN
INTRAVENOUS | Status: AC
  Filled 2020-01-06: qty 1000

## 2020-01-06 MED ORDER — FENTANYL CITRATE (PF) 100 MCG/2ML IJ SOLN
INTRAMUSCULAR | Status: DC | PRN
  Administered 2020-01-06: 25 ug via INTRAVENOUS

## 2020-01-06 MED ORDER — MIDAZOLAM HCL 2 MG/2ML IJ SOLN
INTRAMUSCULAR | Status: DC | PRN
  Administered 2020-01-06: 2 mg via INTRAVENOUS

## 2020-01-06 MED ORDER — SODIUM CHLORIDE 0.9 % WEIGHT BASED INFUSION: 1.0000 mL/kg/h | INTRAVENOUS | Status: DC

## 2020-01-06 MED ORDER — LIDOCAINE HCL (PF) 1 % IJ SOLN
INTRAMUSCULAR | Status: AC
  Filled 2020-01-06: qty 30

## 2020-01-06 MED ORDER — IOHEXOL 350 MG/ML SOLN
INTRAVENOUS | Status: DC | PRN
  Administered 2020-01-06: 65 mL

## 2020-01-06 MED ORDER — VERAPAMIL HCL 2.5 MG/ML IV SOLN
INTRAVENOUS | Status: AC
  Filled 2020-01-06: qty 2

## 2020-01-06 MED ORDER — FENTANYL CITRATE (PF) 100 MCG/2ML IJ SOLN
INTRAMUSCULAR | Status: AC
  Filled 2020-01-06: qty 2

## 2020-01-06 SURGICAL SUPPLY — 10 items
CATH 5FR JL3.5 JR4 ANG PIG MP (CATHETERS) ×2 IMPLANT
CATH OPTITORQUE TIG 4.0 5F (CATHETERS) ×2 IMPLANT
DEVICE RAD COMP TR BAND LRG (VASCULAR PRODUCTS) ×4 IMPLANT
GLIDESHEATH SLEND SS 6F .021 (SHEATH) ×2 IMPLANT
GUIDEWIRE INQWIRE 1.5J.035X260 (WIRE) ×1 IMPLANT
INQWIRE 1.5J .035X260CM (WIRE) ×2
KIT HEART LEFT (KITS) ×2 IMPLANT
PACK CARDIAC CATHETERIZATION (CUSTOM PROCEDURE TRAY) ×2 IMPLANT
TRANSDUCER W/STOPCOCK (MISCELLANEOUS) ×2 IMPLANT
TUBING CIL FLEX 10 FLL-RA (TUBING) ×2 IMPLANT

## 2020-01-06 NOTE — Progress Notes (Signed)
Pt discharged with armboard. Site unremarkable.

## 2020-01-06 NOTE — Progress Notes (Signed)
Corey Bruce was getting his clothes on tho leave and his right wrist started bleeding from his access site for the cath.  We placed a new TR band on with 12cc of air.  Site looks good with no hematoma.  RN will try to deflate again in another hour.

## 2020-01-06 NOTE — Progress Notes (Signed)
Pt radial artery bled when getting pt dressed to go home.  Pressure held. Cath lab over to place new TRB. No hematoma. Bleeding controlled quickly.

## 2020-01-06 NOTE — Discharge Instructions (Signed)
DRINK PLENTY OF FLUIDS FOR THE NEXT 2-3 DAYS.  KEEP ARM ELEVATED THE REMAINDER OF THE DAY.  Radial Site Care  This sheet gives you information about how to care for yourself after your procedure. Your health care provider may also give you more specific instructions. If you have problems or questions, contact your health care provider. What can I expect after the procedure? After the procedure, it is common to have:  Bruising and tenderness at the catheter insertion area. Follow these instructions at home: Medicines  Take over-the-counter and prescription medicines only as told by your health care provider. Insertion site care 1. Follow instructions from your health care provider about how to take care of your insertion site. Make sure you: ? Wash your hands with soap and water before you change your bandage (dressing). If soap and water are not available, use hand sanitizer. ? Change your dressing as told by your health care provider. 2. Check your insertion site every day for signs of infection. Check for: ? Redness, swelling, or pain. ? Fluid or blood. ? Pus or a bad smell. ? Warmth. 3. Do not take baths, swim, or use a hot tub for 5 days. 4. You may shower 24-48 hours after the procedure. ? Remove the dressing and gently wash the site with plain soap and water. ? Pat the area dry with a clean towel. ? Do not rub the site. That could cause bleeding. 5. Do not apply powder or lotion to the site. Activity  1. For 24 hours after the procedure, or as directed by your health care provider: ? Do not flex or bend the affected arm. ? Do not push or pull heavy objects with the affected arm. ? Do not drive yourself home from the hospital or clinic. You may drive 24 hours after the procedure. ? Do not operate machinery or power tools. 2. Do not push, pull or lift anything that is heavier than 10 lb for 5 days. 3. Ask your health care provider when it is okay to: ? Return to work or  school. ? Resume usual physical activities or sports. ? Resume sexual activity. General instructions  If the catheter site starts to bleed, raise your arm and put firm pressure on the site. If the bleeding does not stop, get help right away. This is a medical emergency.  If you went home on the same day as your procedure, a responsible adult should be with you for the first 24 hours after you arrive home.  Keep all follow-up visits as told by your health care provider. This is important. Contact a health care provider if:  You have a fever.  You have redness, swelling, or yellow drainage around your insertion site. Get help right away if:  You have unusual pain at the radial site.  The catheter insertion area swells very fast.  The insertion area is bleeding, and the bleeding does not stop when you hold steady pressure on the area.  Your arm or hand becomes pale, cool, tingly, or numb. These symptoms may represent a serious problem that is an emergency. Do not wait to see if the symptoms will go away. Get medical help right away. Call your local emergency services (911 in the [place] Do not drive yourself to the hospital. Summary  After the procedure, it is common to have bruising and tenderness at the site.  Follow instructions from your health care provider about how to take care of your radial site wound. Check   the wound every day for signs of infection.  Do not push, pull or lift anything that is heavier than 10 lb for 5 days.  This information is not intended to replace advice given to you by your health care provider. Make sure you discuss any questions you have with your health care provider. Document Revised: 03/25/2017 Document Reviewed: 03/25/2017 Elsevier Patient Education  2020 [employer]vier Inc[employer] 

## 2020-01-06 NOTE — Interval H&P Note (Signed)
Cath Lab Visit (complete for each Cath Lab visit)  Clinical Evaluation Leading to the Procedure:   ACS: No.  Non-ACS:    Anginal Classification: CCS II  Anti-ischemic medical therapy: No Therapy  Non-Invasive Test Results: Low-risk stress test findings: cardiac mortality <1%/year  Prior CABG: No previous CABG      History and Physical Interval Note:  01/06/2020 7:39 AM  Corey Bruce  has presented today for surgery, with the diagnosis of abnormal stress test.  The various methods of treatment have been discussed with the patient and family. After consideration of risks, benefits and other options for treatment, the patient has consented to  Procedure(s): LEFT HEART CATH AND CORONARY ANGIOGRAPHY (N/A) as a surgical intervention.  The patient's history has been reviewed, patient examined, no change in status, stable for surgery.  I have reviewed the patient's chart and labs.  Questions were answered to the patient's satisfaction.     Shelva Majestic

## 2020-02-02 DIAGNOSIS — Z23 Encounter for immunization: Secondary | ICD-10-CM | POA: Diagnosis not present

## 2020-02-05 ENCOUNTER — Encounter: Payer: Self-pay | Admitting: Cardiology

## 2020-02-05 NOTE — Progress Notes (Signed)
Cardiology Office Note  Date: 02/06/2020   ID: Corey Bruce, DOB 05/03/2023, MRN [id number]  PCP:  Celene Squibb, MD  Cardiologist:  Rozann Lesches, MD Electrophysiologist:  None   Chief Complaint  Patient presents with  . Cardiac follow-up    History of Present Illness: Corey Bruce is a 69 y.o. male last seen in November and presenting now for follow-up after recent cardiac catheterization.  Procedure was performed by Dr. Claiborne Billings and outlined below showing mild to moderate nonobstructive CAD that was best managed medically.  He presents today for follow-up.  Reports no angina symptoms at this time.  I went over his medications, it looks like he was started on Crestor 10 mg once a week while already being on Lipitor since I last saw him (?).  We discussed simplifying this to Crestor 10 mg once a day and a goal to get his LDL under 70.  Past Medical History:  Diagnosis Date  . CAD (coronary artery disease)    Nonobstructive at cardiac catheterization 01/2020  . 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 Essential hypertension   . Hyperlipidemia   . OSA (obstructive sleep apnea)   . Osteoarthritis     Past Surgical History:  Procedure Laterality Date  . BACK SURGERY     lower  . COLONOSCOPY  02/09/2012   Procedure: COLONOSCOPY;  Surgeon: Daneil Dolin, MD;  [facility];  Service: Endoscopy;  Laterality: N/A;  9:30 had to reschd due to ercp case called patient advised of new time 12:30 for 1:30 case patient stated okay/kr   . HEMORRHOID SURGERY    . LEFT HEART CATH AND CORONARY ANGIOGRAPHY N/A 01/06/2020   Procedure: LEFT HEART CATH AND CORONARY ANGIOGRAPHY;  Surgeon: Troy Sine, MD;  Location: [place] [facility];  Service: Cardiovascular;  Laterality: N/A;  . PARTIAL KNEE ARTHROPLASTY Left 12/03/2015   Procedure: LEFT UNICOMPARTMENTAL KNEE;  Surgeon: Elsie Saas, MD;  Location: [place];  Service: Orthopedics;  Laterality:  Left;    Current Outpatient Medications  Medication Sig Dispense Refill  . aspirin EC 81 MG tablet Take 81 mg by mouth daily. Swallow whole.     . olmesartan (BENICAR) 20 MG tablet Take 20 mg by mouth daily.    . rosuvastatin (CRESTOR) 10 MG tablet Take 1 tablet (10 mg total) by mouth daily. 90 tablet 3   Current Facility-Administered Medications  Medication Dose Route Frequency Provider Last Rate Last Admin  . sodium chloride flush (NS) 0.9 % injection 3 mL  3 mL Intravenous Q12H Satira Sark, MD       Allergies:  Erythromycin   ROS: No palpitations or syncope.  Physical Exam: VS:  BP 120/82   Pulse 66   Ht 6\' 1"  (1.854 m)   Wt 229 lb 12.8 oz (104.2 kg)   SpO2 98%   BMI 30.32 kg/m , BMI Body mass index is 30.32 kg/m.  Wt Readings from Last 3 Encounters:  02/06/20 229 lb 12.8 oz (104.2 kg)  01/06/20 216 lb (98 kg)  01/05/20 216 lb (98 kg)    General: Patient appears comfortable at rest. HEENT: Conjunctiva and lids normal, oropharynx clear with moist mucosa. Neck: Supple, no elevated JVP or carotid bruits, no thyromegaly. Lungs: Clear to auscultation, nonlabored breathing at rest. Cardiac: Regular rate and rhythm, no S3 or significant systolic murmur, no pericardial rub. Extremities: No pitting edema.  Right radial access site  well-healed and stable.  ECG:  An ECG dated 01/06/2020 was personally reviewed today and demonstrated:  Sinus bradycardia.  Recent Labwork: 01/04/2020: BUN 19; Creatinine, Ser 0.86; Hemoglobin 15.0; Platelets 183; Potassium 3.6; Sodium 138  July 2021: Hemoglobin 14.5, platelets 173, BUN 16, creatinine 0.8, potassium 4.4, AST 17, ALT 19, cholesterol 170, triglycerides 112, HDL 41, LDL 109  Other Studies Reviewed Today:  Cardiac catheterization 01/06/2020:  Prox RCA lesion is 20% stenosed.  Dist RCA lesion is 20% stenosed.  Mid LAD lesion is 50% stenosed.  2nd Diag lesion is 15% stenosed.  The left ventricular systolic function is  normal.  LV end diastolic pressure is normal.  The left ventricular ejection fraction is 55-65% by visual estimate.   Mild to moderate  CAD with smooth 50% LAD stenosis after the second diagonal vessel with mild 10 to 15% narrowing at the ostium of the diagonal vessel.  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  Systolic bridging improved following intracoronary nitroglycerin administration.  Normal left circumflex coronary artery.  Dominant RCA with mild 20% mid and distal smooth narrowing.  Normal LV contractility without wall motion abnormality and EF estimate at 60%.  LVEDP 8 mmHg.  Assessment and Plan:  1.  Mild to moderate nonobstructive CAD by recent cardiac catheterization.  He reports no active angina symptoms at this time and plan will be medical therapy and observation.  Continue aspirin and Benicar.  No clear indication for beta-blocker.  Switch statin therapy to Crestor 10 mg once daily.  2.  Essential hypertension, blood pressure well controlled today on Benicar.  3.  Mixed hyperlipidemia, last LDL was 109 in July on Lipitor 10 mg daily.  Recheck FLP and LFTs for next visit on Crestor.  Medication Adjustments/Labs and Tests Ordered: Current medicines are reviewed at length with the patient today.  Concerns regarding medicines are outlined above.   Tests Ordered: Orders Placed This Encounter  Procedures  . Lipid panel  . Hepatic function panel    Medication Changes: Meds ordered this encounter  Medications  . rosuvastatin (CRESTOR) 10 MG tablet    Sig: Take 1 tablet (10 mg total) by mouth daily.    Dispense:  90 tablet    Refill:  3    02/06/2020 dose increase-stop atorvastatin    Disposition:  Follow up 6 months in the [place] office.  Signed, Satira Sark, MD, [facility] 02/06/2020 8:35 AM    [facility] [place], [place], [place] [address] Phone: [phone]; Fax: [phone]

## 2020-02-06 ENCOUNTER — Encounter: Payer: Self-pay | Admitting: Cardiology

## 2020-02-06 ENCOUNTER — Ambulatory Visit (INDEPENDENT_AMBULATORY_CARE_PROVIDER_SITE_OTHER): Payer: Medicare Other | Admitting: Cardiology

## 2020-02-06 VITALS — BP 120/82 | HR 66 | Ht 73.0 in | Wt 229.8 lb

## 2020-02-06 DIAGNOSIS — Z79899 Other long term (current) drug therapy: Secondary | ICD-10-CM | POA: Diagnosis not present

## 2020-02-06 DIAGNOSIS — I25119 Atherosclerotic heart disease of native coronary artery with unspecified angina pectoris: Secondary | ICD-10-CM | POA: Diagnosis not present

## 2020-02-06 DIAGNOSIS — E782 Mixed hyperlipidemia: Secondary | ICD-10-CM | POA: Diagnosis not present

## 2020-02-06 MED ORDER — ROSUVASTATIN CALCIUM 10 MG PO TABS: 10.0000 mg | ORAL_TABLET | Freq: Every day | ORAL | 3 refills | Status: DC

## 2020-02-06 NOTE — Patient Instructions (Addendum)
Medication Instructions:   Your physician has recommended you make the following change in your medication:   Stop atorvastatin  Increase rosuvastatin 10 mg daily  Continue other medications the same  Labwork: Your physician recommends that you return for a FASTING lipid/liver in 6 months just before your next visit. Please do not eat or drink for at least 8 hours when you have this done. You may take your medications that morning with a sip of water.  This may be done at [facility] from 8:00 am - 4:00 pm except 11:30 am - 12:10 pm. No appointment is needed.  Testing/Procedures:  None  Follow-Up:  Your physician recommends that you schedule a follow-up appointment in: 6 months.  Any Other Special Instructions Will Be Listed Below (If Applicable).  If you need a refill on your cardiac medications before your next appointment, please call your pharmacy.

## 2020-03-02 DIAGNOSIS — I10 Essential (primary) hypertension: Secondary | ICD-10-CM | POA: Diagnosis not present

## 2020-03-02 DIAGNOSIS — E782 Mixed hyperlipidemia: Secondary | ICD-10-CM | POA: Diagnosis not present

## 2020-03-02 DIAGNOSIS — E6609 Other obesity due to excess calories: Secondary | ICD-10-CM | POA: Diagnosis not present

## 2020-03-02 DIAGNOSIS — Z0000 Encounter for general adult medical examination without abnormal findings: Secondary | ICD-10-CM | POA: Diagnosis not present

## 2020-03-02 DIAGNOSIS — Z8249 Family history of ischemic heart disease and other diseases of the circulatory system: Secondary | ICD-10-CM | POA: Diagnosis not present

## 2020-04-30 DIAGNOSIS — I10 Essential (primary) hypertension: Secondary | ICD-10-CM | POA: Diagnosis not present

## 2020-04-30 DIAGNOSIS — E782 Mixed hyperlipidemia: Secondary | ICD-10-CM | POA: Diagnosis not present

## 2020-04-30 DIAGNOSIS — Z8249 Family history of ischemic heart disease and other diseases of the circulatory system: Secondary | ICD-10-CM | POA: Diagnosis not present

## 2020-05-30 DIAGNOSIS — Z8249 Family history of ischemic heart disease and other diseases of the circulatory system: Secondary | ICD-10-CM | POA: Diagnosis not present

## 2020-05-30 DIAGNOSIS — E782 Mixed hyperlipidemia: Secondary | ICD-10-CM | POA: Diagnosis not present

## 2020-05-30 DIAGNOSIS — E6609 Other obesity due to excess calories: Secondary | ICD-10-CM | POA: Diagnosis not present

## 2020-05-30 DIAGNOSIS — G4710 Hypersomnia, unspecified: Secondary | ICD-10-CM | POA: Diagnosis not present

## 2020-05-30 DIAGNOSIS — E7849 Other hyperlipidemia: Secondary | ICD-10-CM | POA: Diagnosis not present

## 2020-05-30 DIAGNOSIS — Z6830 Body mass index (BMI) 30.0-30.9, adult: Secondary | ICD-10-CM | POA: Diagnosis not present

## 2020-05-30 DIAGNOSIS — Z23 Encounter for immunization: Secondary | ICD-10-CM | POA: Diagnosis not present

## 2020-05-30 DIAGNOSIS — I10 Essential (primary) hypertension: Secondary | ICD-10-CM | POA: Diagnosis not present

## 2020-06-01 DIAGNOSIS — I10 Essential (primary) hypertension: Secondary | ICD-10-CM | POA: Diagnosis not present

## 2020-06-01 DIAGNOSIS — I25119 Atherosclerotic heart disease of native coronary artery with unspecified angina pectoris: Secondary | ICD-10-CM | POA: Diagnosis not present

## 2020-06-01 DIAGNOSIS — E782 Mixed hyperlipidemia: Secondary | ICD-10-CM | POA: Diagnosis not present

## 2020-06-01 DIAGNOSIS — Z8249 Family history of ischemic heart disease and other diseases of the circulatory system: Secondary | ICD-10-CM | POA: Diagnosis not present

## 2020-07-06 DIAGNOSIS — I10 Essential (primary) hypertension: Secondary | ICD-10-CM | POA: Diagnosis not present

## 2020-07-06 DIAGNOSIS — E7849 Other hyperlipidemia: Secondary | ICD-10-CM | POA: Diagnosis not present

## 2020-08-03 ENCOUNTER — Encounter: Payer: Self-pay | Admitting: *Deleted

## 2020-08-05 NOTE — Progress Notes (Signed)
Cardiology Office Note  Date: 08/06/2020   ID: Corey Bruce, DOB [birth year], MRN [id number]  PCP:  Celene Squibb, MD  Cardiologist:  Rozann Lesches, MD Electrophysiologist:  None   Chief Complaint  Patient presents with  . Cardiac follow-up    History of Present Illness: Corey Bruce is a 70 y.o. male last seen in December 2021.  He is here for a routine visit.  States that he is doing very well, no exertional angina or unusual shortness of breath.  He has continued with house flips, really seems to enjoy what he is doing.  Gets at least 10,000 steps a day.  He had lipids done with Dr. Nevada Crane, we are requesting the results.  I went over his medications which are stable and noted below.  He has tolerated Crestor.  Past Medical History:  Diagnosis Date  . CAD (coronary artery disease)    Nonobstructive at cardiac catheterization 01/2020  . 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 Essential hypertension   . Hyperlipidemia   . OSA (obstructive sleep apnea)   . Osteoarthritis     Past Surgical History:  Procedure Laterality Date  . BACK SURGERY     lower  . COLONOSCOPY  02/09/2012   Procedure: COLONOSCOPY;  Surgeon: Daneil Dolin, MD;  [facility];  Service: Endoscopy;  Laterality: N/A;  9:30 had to reschd due to ercp case called patient advised of new time 12:30 for 1:30 case patient stated okay/kr   . HEMORRHOID SURGERY    . LEFT HEART CATH AND CORONARY ANGIOGRAPHY N/A 01/06/2020   Procedure: LEFT HEART CATH AND CORONARY ANGIOGRAPHY;  Surgeon: Troy Sine, MD;  Location: [place] [facility];  Service: Cardiovascular;  Laterality: N/A;  . PARTIAL KNEE ARTHROPLASTY Left 12/03/2015   Procedure: LEFT UNICOMPARTMENTAL KNEE;  Surgeon: Elsie Saas, MD;  Location: [place];  Service: Orthopedics;  Laterality: Left;    Current Outpatient Medications  Medication Sig Dispense Refill  . aspirin EC 81 MG tablet Take 81 mg by mouth  daily. Swallow whole.     . olmesartan (BENICAR) 20 MG tablet Take 20 mg by mouth daily.    . rosuvastatin (CRESTOR) 10 MG tablet Take 1 tablet by mouth daily.     Current Facility-Administered Medications  Medication Dose Route Frequency Provider Last Rate Last Admin  . sodium chloride flush (NS) 0.9 % injection 3 mL  3 mL Intravenous Q12H Satira Sark, MD       Allergies:  Erythromycin   ROS: No palpitations, dizziness, or syncope.  Physical Exam: VS:  BP 128/70   Pulse 60   Ht 6\' 2"  (1.88 m)   Wt 224 lb 12.8 oz (102 kg)   SpO2 97%   BMI 28.86 kg/m , BMI Body mass index is 28.86 kg/m.  Wt Readings from Last 3 Encounters:  08/06/20 224 lb 12.8 oz (102 kg)  02/06/20 229 lb 12.8 oz (104.2 kg)  01/06/20 216 lb (98 kg)    General: Patient appears comfortable at rest. HEENT: Conjunctiva and lids normal, wearing a mask. Neck: Supple, no elevated JVP or carotid bruits, no thyromegaly. Lungs: Clear to auscultation, nonlabored breathing at rest. Cardiac: Regular rate and rhythm, no S3 or significant systolic murmur, no pericardial rub. Extremities: No pitting edema.  ECG:  An ECG dated 01/06/2020 was personally reviewed today and demonstrated:  Sinus bradycardia.  Recent Labwork: 01/04/2020: BUN 19; Creatinine, Ser 0.86;  Hemoglobin 15.0; Platelets 183; Potassium 3.6; Sodium 138  July 2021: Hemoglobin 14.5, platelets 173, BUN 16, creatinine 0.8, potassium 4.4, AST 17, ALT 19, cholesterol 170, triglycerides 112, HDL 41, LDL 109  Other Studies Reviewed Today:  Cardiac catheterization 01/06/2020:  Prox RCA lesion is 20% stenosed.  Dist RCA lesion is 20% stenosed.  Mid LAD lesion is 50% stenosed.  2nd Diag lesion is 15% stenosed.  The left ventricular systolic function is normal.  LV end diastolic pressure is normal.  The left ventricular ejection fraction is 55-65% by visual estimate.  Mild to moderate CAD with smooth 50% LAD stenosis after the second diagonal  vessel with mild 10 to 15% narrowing at the ostium of the diagonal vessel. 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 Systolic bridging improved following intracoronary nitroglycerin administration. Normal left circumflex coronary artery. Dominant RCA with mild 20% mid and distal smooth narrowing.  Normal LV contractility without wall motion abnormality and EF estimate at 60%. LVEDP 8 mmHg.  Assessment and Plan:  1.  Mild to moderate nonobstructive CAD as outlined above.  He is asymptomatic at this time and plan is to continue observation on medical therapy.  Currently on aspirin, Benicar, and Crestor.  2.  Mixed hyperlipidemia, last LDL 109.  Requesting interval lab work from Dr. Nevada Crane on Crestor.  3.  Essential hypertension, systolic in the 878M today.  No changes made.  Medication Adjustments/Labs and Tests Ordered: Current medicines are reviewed at length with the patient today.  Concerns regarding medicines are outlined above.   Tests Ordered: No orders of the defined types were placed in this encounter.   Medication Changes: No orders of the defined types were placed in this encounter.   Disposition:  Follow up 1 year.  Signed, Satira Sark, MD, [facility] 08/06/2020 8:44 AM    [facility] [place], [place], [place] [address] Phone: [phone]; Fax: [phone]

## 2020-08-06 ENCOUNTER — Encounter: Payer: Self-pay | Admitting: Cardiology

## 2020-08-06 ENCOUNTER — Ambulatory Visit (INDEPENDENT_AMBULATORY_CARE_PROVIDER_SITE_OTHER): Payer: Medicare Other | Admitting: Cardiology

## 2020-08-06 VITALS — BP 128/70 | HR 60 | Ht 74.0 in | Wt 224.8 lb

## 2020-08-06 DIAGNOSIS — I25119 Atherosclerotic heart disease of native coronary artery with unspecified angina pectoris: Secondary | ICD-10-CM | POA: Diagnosis not present

## 2020-08-06 DIAGNOSIS — E782 Mixed hyperlipidemia: Secondary | ICD-10-CM | POA: Diagnosis not present

## 2020-08-06 DIAGNOSIS — I10 Essential (primary) hypertension: Secondary | ICD-10-CM

## 2020-08-06 NOTE — Patient Instructions (Addendum)

## 2020-08-28 ENCOUNTER — Other Ambulatory Visit: Payer: Self-pay | Admitting: Podiatry

## 2020-08-28 ENCOUNTER — Other Ambulatory Visit: Payer: Self-pay

## 2020-08-28 ENCOUNTER — Ambulatory Visit (INDEPENDENT_AMBULATORY_CARE_PROVIDER_SITE_OTHER): Payer: Medicare Other

## 2020-08-28 ENCOUNTER — Ambulatory Visit (INDEPENDENT_AMBULATORY_CARE_PROVIDER_SITE_OTHER): Payer: Medicare Other | Admitting: Podiatry

## 2020-08-28 DIAGNOSIS — M2041 Other hammer toe(s) (acquired), right foot: Secondary | ICD-10-CM

## 2020-08-28 DIAGNOSIS — B351 Tinea unguium: Secondary | ICD-10-CM

## 2020-08-28 DIAGNOSIS — M79671 Pain in right foot: Secondary | ICD-10-CM

## 2020-08-28 DIAGNOSIS — M79674 Pain in right toe(s): Secondary | ICD-10-CM

## 2020-08-28 MED ORDER — CICLOPIROX 8 % EX SOLN: Freq: Every day | CUTANEOUS | 0 refills | Status: DC

## 2020-08-28 MED ORDER — FLUCONAZOLE 150 MG PO TABS: 150.0000 mg | ORAL_TABLET | ORAL | 2 refills | Status: DC

## 2020-08-28 NOTE — Progress Notes (Signed)
  Subjective:  Patient ID: Corey Bruce, male    DOB: [birth year],  MRN: [id number]  Chief Complaint  Patient presents with   Foot Problem    Top of foot mainly on or around 2nd toe and ball of foot. Pain is manageable usually by 6-7k steps but pt manages 16-17K steps a day.    70 y.o. male presents with the above complaint. History confirmed with patient.   Objective:  Physical Exam: warm, good capillary refill, no trophic changes or ulcerative lesions, normal DP and PT pulses, and normal sensory exam. Nails thickened, yellowed, longitudinal ridging. Right Foot: POP right 2nd toe medial PIPJ with soft corn. Additional lesion lateral 1st IPJ.   No images are attached to the encounter.  Radiographs: X-ray of the right foot: no fracture, dislocation, swelling or degenerative changes noted and digital contractures Assessment:   1. Hammertoe of right foot   2. Onychomycosis   3. Pain in toe of right foot    Plan:  Patient was evaluated and treated and all questions answered.  Hammertoe -XR reviewed with patient -Educated on etiology of deformity -Discussed padding and shoe gear changes -Dispensed toe spacers -Discussed with patient that they would benefit from surgical intervention after having failed all conservative therapy.  Onychomycosis -Educated on etiology of nail fungus.  -eRx for fluconazole and penlac. Educated on risks and benefits of the medication.    Return in about 6 weeks (around 10/09/2020) for Nail Fungus.

## 2020-08-30 DIAGNOSIS — I10 Essential (primary) hypertension: Secondary | ICD-10-CM | POA: Diagnosis not present

## 2020-08-30 DIAGNOSIS — E7849 Other hyperlipidemia: Secondary | ICD-10-CM | POA: Diagnosis not present

## 2020-10-09 ENCOUNTER — Other Ambulatory Visit: Payer: Self-pay

## 2020-10-09 ENCOUNTER — Ambulatory Visit (INDEPENDENT_AMBULATORY_CARE_PROVIDER_SITE_OTHER): Payer: Medicare Other | Admitting: Podiatry

## 2020-10-09 DIAGNOSIS — B351 Tinea unguium: Secondary | ICD-10-CM

## 2020-10-09 DIAGNOSIS — M2041 Other hammer toe(s) (acquired), right foot: Secondary | ICD-10-CM

## 2020-10-09 DIAGNOSIS — M79674 Pain in right toe(s): Secondary | ICD-10-CM

## 2020-10-09 MED ORDER — CICLOPIROX 8 % EX SOLN
Freq: Every day | CUTANEOUS | 0 refills | Status: DC
Start: 2020-10-09 — End: 2022-10-29

## 2020-10-09 NOTE — Progress Notes (Signed)
  Subjective:  Patient ID: Corey Bruce, male    DOB: [id number],  MRN: [id number]  Chief Complaint  Patient presents with   Nail Problem    Follow up nail fungus. Pt states improvement.    70 y.o. male presents with the above complaint. History confirmed with patient.   Objective:  Physical Exam: warm, good capillary refill, no trophic changes or ulcerative lesions, normal DP and PT pulses, and normal sensory exam. Nails thickened, yellowed, longitudinal ridging. Right Foot: No POP right 2nd toe medial PIPJ; still with soft corn. Additional lesion lateral 1st IPJ.  2nd toe hammertoe deformity.   Assessment:   1. Hammertoe of right foot   2. Onychomycosis   3. Pain in toe of right foot     Plan:  Patient was evaluated and treated and all questions answered.  Hammertoe -Much improve with spacer. Patient has been doing self-care regimen that allows him to walk a lot during the day. Less likely he will need surgical intervention but will f/u in 2 months or sooner if issues persist.   Onychomycosis -Improving. Continue penlac and fluconazole. Refill penlac.    Return in about 2 months (around 12/09/2020) for Nail Fungus, hammertoe f/u.

## 2020-10-12 DIAGNOSIS — M7611 Psoas tendinitis, right hip: Secondary | ICD-10-CM | POA: Diagnosis not present

## 2020-10-12 DIAGNOSIS — M9905 Segmental and somatic dysfunction of pelvic region: Secondary | ICD-10-CM | POA: Diagnosis not present

## 2020-10-29 DIAGNOSIS — M1711 Unilateral primary osteoarthritis, right knee: Secondary | ICD-10-CM | POA: Diagnosis not present

## 2020-11-23 ENCOUNTER — Other Ambulatory Visit: Payer: Self-pay | Admitting: Podiatry

## 2020-11-29 DIAGNOSIS — I10 Essential (primary) hypertension: Secondary | ICD-10-CM | POA: Diagnosis not present

## 2020-11-29 DIAGNOSIS — E782 Mixed hyperlipidemia: Secondary | ICD-10-CM | POA: Diagnosis not present

## 2020-11-29 DIAGNOSIS — X32XXXA Exposure to sunlight, initial encounter: Secondary | ICD-10-CM | POA: Diagnosis not present

## 2020-11-29 DIAGNOSIS — R7301 Impaired fasting glucose: Secondary | ICD-10-CM | POA: Diagnosis not present

## 2020-11-29 DIAGNOSIS — L570 Actinic keratosis: Secondary | ICD-10-CM | POA: Diagnosis not present

## 2020-11-30 DIAGNOSIS — I10 Essential (primary) hypertension: Secondary | ICD-10-CM | POA: Diagnosis not present

## 2020-11-30 DIAGNOSIS — E7849 Other hyperlipidemia: Secondary | ICD-10-CM | POA: Diagnosis not present

## 2020-12-04 DIAGNOSIS — I2510 Atherosclerotic heart disease of native coronary artery without angina pectoris: Secondary | ICD-10-CM | POA: Diagnosis not present

## 2020-12-04 DIAGNOSIS — R7303 Prediabetes: Secondary | ICD-10-CM | POA: Diagnosis not present

## 2020-12-04 DIAGNOSIS — R7401 Elevation of levels of liver transaminase levels: Secondary | ICD-10-CM | POA: Diagnosis not present

## 2020-12-04 DIAGNOSIS — Z8249 Family history of ischemic heart disease and other diseases of the circulatory system: Secondary | ICD-10-CM | POA: Diagnosis not present

## 2020-12-04 DIAGNOSIS — R252 Cramp and spasm: Secondary | ICD-10-CM | POA: Diagnosis not present

## 2020-12-04 DIAGNOSIS — E782 Mixed hyperlipidemia: Secondary | ICD-10-CM | POA: Diagnosis not present

## 2020-12-04 DIAGNOSIS — I10 Essential (primary) hypertension: Secondary | ICD-10-CM | POA: Diagnosis not present

## 2020-12-14 ENCOUNTER — Ambulatory Visit (INDEPENDENT_AMBULATORY_CARE_PROVIDER_SITE_OTHER): Payer: Medicare Other | Admitting: Podiatry

## 2020-12-14 ENCOUNTER — Other Ambulatory Visit: Payer: Self-pay

## 2020-12-14 DIAGNOSIS — M2041 Other hammer toe(s) (acquired), right foot: Secondary | ICD-10-CM

## 2020-12-14 DIAGNOSIS — B351 Tinea unguium: Secondary | ICD-10-CM | POA: Diagnosis not present

## 2020-12-14 MED ORDER — FLUCONAZOLE 150 MG PO TABS
150.0000 mg | ORAL_TABLET | ORAL | 2 refills | Status: DC
Start: 2020-12-14 — End: 2021-05-29

## 2020-12-17 DIAGNOSIS — M1711 Unilateral primary osteoarthritis, right knee: Secondary | ICD-10-CM | POA: Diagnosis not present

## 2020-12-24 NOTE — Progress Notes (Signed)
  Subjective:  Patient ID: MELESIO MADARA, male    DOB: [birth year],  MRN: [id number]  Chief Complaint  Patient presents with   Nail Problem    Pt states medication is effective for nails    Hammer Toe    Corn trim hammertoe, spacer is helpful   70 y.o. male presents with the above complaint. History confirmed with patient.   Objective:  Physical Exam: warm, good capillary refill, no trophic changes or ulcerative lesions, normal DP and PT pulses, and normal sensory exam. Nails thickened, yellowed, longitudinal ridging but with proximal clearing Right Foot: No POP right 2nd toe medial PIPJ; still with soft corn. Additional lesion lateral 1st IPJ.  2nd toe hammertoe deformity.   Assessment:   1. Hammertoe of right foot   2. Onychomycosis      Plan:  Patient was evaluated and treated and all questions answered.  Hammertoe -Improving with spacer hold off surgical intervention unless pain is unable to be controlled by spacer and patient would like to discuss other options.  Onychomycosis -Continue current regimen -Refill Diflucan    Return if symptoms worsen or fail to improve.

## 2020-12-27 DIAGNOSIS — M1711 Unilateral primary osteoarthritis, right knee: Secondary | ICD-10-CM | POA: Diagnosis not present

## 2021-01-07 DIAGNOSIS — M1711 Unilateral primary osteoarthritis, right knee: Secondary | ICD-10-CM | POA: Diagnosis not present

## 2021-01-10 DIAGNOSIS — M6281 Muscle weakness (generalized): Secondary | ICD-10-CM | POA: Diagnosis not present

## 2021-01-10 DIAGNOSIS — M1711 Unilateral primary osteoarthritis, right knee: Secondary | ICD-10-CM | POA: Diagnosis not present

## 2021-01-10 DIAGNOSIS — M25561 Pain in right knee: Secondary | ICD-10-CM | POA: Diagnosis not present

## 2021-04-22 ENCOUNTER — Emergency Department (HOSPITAL_COMMUNITY): Payer: Medicare Other

## 2021-04-22 ENCOUNTER — Encounter (HOSPITAL_COMMUNITY): Payer: Self-pay

## 2021-04-22 ENCOUNTER — Emergency Department (HOSPITAL_COMMUNITY)
Admission: EM | Admit: 2021-04-22 | Discharge: 2021-04-22 | Disposition: A | Discharge status: Home / Self Care | Payer: Medicare Other | Attending: Emergency Medicine | Admitting: Emergency Medicine

## 2021-04-22 ENCOUNTER — Other Ambulatory Visit: Payer: Self-pay

## 2021-04-22 ENCOUNTER — Telehealth: Payer: Self-pay | Admitting: Cardiology

## 2021-04-22 DIAGNOSIS — W312XXA Contact with powered woodworking and forming machines, initial encounter: Secondary | ICD-10-CM | POA: Insufficient documentation

## 2021-04-22 DIAGNOSIS — Z7982 Long term (current) use of aspirin: Secondary | ICD-10-CM | POA: Diagnosis not present

## 2021-04-22 DIAGNOSIS — M1711 Unilateral primary osteoarthritis, right knee: Secondary | ICD-10-CM | POA: Diagnosis not present

## 2021-04-22 DIAGNOSIS — Z23 Encounter for immunization: Secondary | ICD-10-CM | POA: Diagnosis not present

## 2021-04-22 DIAGNOSIS — S6992XA Unspecified injury of left wrist, hand and finger(s), initial encounter: Secondary | ICD-10-CM | POA: Diagnosis present

## 2021-04-22 DIAGNOSIS — S61012A Laceration without foreign body of left thumb without damage to nail, initial encounter: Secondary | ICD-10-CM | POA: Diagnosis not present

## 2021-04-22 MED ORDER — LIDOCAINE HCL (PF) 2 % IJ SOLN
INTRAMUSCULAR | Status: AC
  Filled 2021-04-22: qty 10

## 2021-04-22 MED ORDER — TETANUS-DIPHTH-ACELL PERTUSSIS 5-2.5-18.5 LF-MCG/0.5 IM SUSY
0.5000 mL | PREFILLED_SYRINGE | Freq: Once | INTRAMUSCULAR | Status: AC
  Administered 2021-04-22: 0.5 mL via INTRAMUSCULAR
  Filled 2021-04-22: qty 0.5

## 2021-04-22 MED ORDER — LIDOCAINE HCL (PF) 2 % IJ SOLN: 5.0000 mL | Freq: Once | INTRAMUSCULAR | Status: DC

## 2021-04-22 MED ORDER — POVIDONE-IODINE 10 % EX SOLN
CUTANEOUS | Status: DC | PRN
  Filled 2021-04-22: qty 14.8

## 2021-04-22 MED ORDER — CEPHALEXIN 500 MG PO CAPS
500.0000 mg | ORAL_CAPSULE | Freq: Once | ORAL | Status: AC
  Administered 2021-04-22: 500 mg via ORAL
  Filled 2021-04-22: qty 1

## 2021-04-22 MED ORDER — CEPHALEXIN 500 MG PO CAPS: 500.0000 mg | ORAL_CAPSULE | Freq: Three times a day (TID) | ORAL | 0 refills | Status: DC

## 2021-04-22 NOTE — Discharge Instructions (Signed)
Keep the wound clean with mild soap and water.  Keep the area bandaged and you may use the finger splint as needed for support.  Sutures out in 8 to 10 days.  Follow-up with your primary care for recheck.  Return to the emergency department for any new or worsening symptoms or signs of infection.

## 2021-04-22 NOTE — Telephone Encounter (Signed)
° °  Pre-operative Risk Assessment    Patient Name: Corey Bruce  DOB: [birth year] MRN: [id number]      Request for Surgical Clearance    Procedure:   Rt partial vs total knee replacement  Date of Surgery:  Clearance TBD                                 Surgeon:  Charlies Constable  Surgeon's Group or Practice Name:  [facility] Phone number:  [phone] ext [place] Fax number:  [phone]   Type of Clearance Requested:   - Medical    Type of Anesthesia:  Spinal   Additional requests/questions:  Please fax a copy of last office note, pertinent records to the surgeon's office.  Signed, Desma Paganini   04/22/2021, 4:21 PM

## 2021-04-22 NOTE — ED Provider Notes (Signed)
[facility] Provider Note   CSN: [id number] Arrival date & time: 04/22/21  9983     History  Chief Complaint  Patient presents with   Laceration    Corey Bruce is a 71 y.o. male.   Laceration     Corey Bruce is a 71 y.o. male who presents to the Emergency Department complaining of laceration of middle left thumb.  He was cutting wood with a table saw shortly before ER arrival.  He was not wearing gloves at the time.  He continues to have full sensation of his thumb and able to move the joints of his thumb without difficulty.  He denies feeling numbness or weakness of his hand or thumb.  Some bleeding initially that was controlled with pressure.  He believes that his last tetanus was approximately 5 years ago.  Home Medications Prior to Admission medications   Medication Sig Start Date End Date Taking? Authorizing Provider  aspirin EC 81 MG tablet Take 81 mg by mouth daily. Swallow whole.     [provider]  atorvastatin (LIPITOR) 10 MG tablet  01/31/20   [provider]  ciclopirox (PENLAC) 8 % solution Apply topically at bedtime. Apply over nail and surrounding skin. Apply daily over previous coat. Remove weekly with file or polish remover. 10/09/20   Evelina Bucy, DPM  fluconazole (DIFLUCAN) 150 MG tablet Take 1 tablet (150 mg total) by mouth once a week. 12/14/20   Evelina Bucy, DPM  olmesartan (BENICAR) 20 MG tablet Take 20 mg by mouth daily. 10/17/19   [provider]      Allergies    Erythromycin    Review of Systems   Review of Systems  Skin:  Positive for wound.       Laceration left thumb  Neurological:  Negative for weakness and numbness.  All other systems reviewed and are negative.  Physical Exam Updated Vital Signs BP 134/83 (BP Location: Right Arm)    Pulse (!) 58    Temp 97.8 F (36.6 C) (Oral)    Resp 16    Ht 6\' 2"  (1.88 m)    Wt 105.9 kg    SpO2 98%    BMI 29.97 kg/m  Physical  Exam Vitals and nursing note reviewed.  Constitutional:      Appearance: Normal appearance.  Cardiovascular:     Rate and Rhythm: Normal rate and regular rhythm.     Pulses: Normal pulses.  Pulmonary:     Effort: Pulmonary effort is normal.  Musculoskeletal:        General: Signs of injury present.     Left hand: Laceration present. No bony tenderness. Normal strength. Normal sensation. Normal capillary refill.     Comments: 4 cm laceration to the palmar aspect of the left thumb.  Bleeding controlled.  No foreign body seen on exam.  Patient has full range of motion of the thumb.  No nail involvement.  Skin:    General: Skin is warm.     Capillary Refill: Capillary refill takes less than 2 seconds.  Neurological:     General: No focal deficit present.     Mental Status: He is alert.     Sensory: No sensory deficit.     Motor: No weakness.    ED Results / Procedures / Treatments   Labs (all labs ordered are listed, but only abnormal results are displayed) Labs Reviewed - No data to display  EKG None  Radiology  DG Finger Thumb Left  Result Date: 04/22/2021 CLINICAL DATA:  Laceration to anterior aspect of the left thumb from a circular this a.m. EXAM: LEFT THUMB 2+V COMPARISON:  None. FINDINGS: Normal bone mineralization. No radiopaque foreign body is seen. Possible volar thumb nondisplaced soft tissue injury near the interphalangeal joint. Large dorsal distal thumb proximal phalanx and moderate adjacent base of the distal phalanx degenerative osteophytes. Severe interphalangeal joint space narrowing. Moderate thumb carpometacarpal joint space narrowing. No acute fracture or dislocation.  No radiopaque foreign body. IMPRESSION:: IMPRESSION: 1. No radiopaque foreign body is seen. 2. No acute fracture. 3. 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 Electronically Signed   By: Yvonne Kendall M.D.   On: 04/22/2021 10:44     Procedures Procedures    LACERATION REPAIR Performed by: Thaddius Manes Authorized by: Sekai Gitlin Consent: Verbal consent obtained. Risks and benefits: risks, benefits and alternatives were discussed Consent given by: patient Patient identity confirmed: provided demographic data Prepped and Draped in normal sterile fashion Wound explored  Laceration Location: Left thumb  Laceration Length: 4 cm  No Foreign Bodies seen or palpated  Anesthesia: local infiltration  Local anesthetic: lidocaine 2% without epinephrine  Anesthetic total: 3 ml  Irrigation method: syringe Amount of cleaning: standard  Skin closure: 4-0 Ethilon  Number of sutures: 8  Technique: Simple interrupted  Patient tolerance: Patient tolerated the procedure well with no immediate complications.   Medications Ordered in ED Medications  povidone-iodine (BETADINE) 10 % external solution (has no administration in time range)  lidocaine HCl (PF) (XYLOCAINE) 2 % injection 5 mL (has no administration in time range)  Tdap (BOOSTRIX) injection 0.5 mL (0.5 mLs Intramuscular Given 04/22/21 1113)  cephALEXin (KEFLEX) capsule 500 mg (500 mg Oral Given 04/22/21 1116)    ED Course/ Medical Decision Making/ A&P                           Medical Decision Making OfPatient here with laceration distal thumb that occurred while using a table saw.  No foreign bodies seen on exam.  Wound explored through depth of wound and range of motion.  Neurovascularly intact.  Bleeding controlled prior to arrival.  Patient does not take blood thinners.  Td updated here, wound irrigated and well approximated.  There was no involvement of the nail.  Amount and/or Complexity of Data Reviewed External Data Reviewed: notes. Radiology: ordered.    Details: X-ray of the left thumb without evidence of radiopaque foreign body or fracture.  I have personally reviewed images and agree with radiology interpretation  Risk OTC  drugs. Prescription drug management.   Patient tolerated procedure well.  Wound well approximated with sutures.  TD updated here.  We will start patient on antibiotics as well.  Wound care instructions discussed.  Sutures out in 8 to 10 days.  Patient agreeable to plan.  Appropriate for discharge home at this time.  Return precautions discussed        Final Clinical Impression(s) / ED Diagnoses Final diagnoses:  Laceration of left thumb without foreign body without damage to nail, initial encounter    Rx / DC Orders ED Discharge Orders     None         Kem Parkinson, PA-C 04/25/21 1436    Davonna Belling, MD 04/25/21 1456

## 2021-04-22 NOTE — ED Triage Notes (Signed)
Patients arrives via pov with wife. Patient using table saw this am, pt left thumb accidentally went over top of blade.

## 2021-04-23 NOTE — Telephone Encounter (Signed)
° °  Name: Corey Bruce  DOB: [birth year]  MRN: [id number]   Primary Cardiologist: Rozann Lesches, MD  Chart reviewed as part of pre-operative protocol coverage. Patient was contacted 04/23/2021 in reference to pre-operative risk assessment for pending surgery as outlined below.  LADONTE VERSTRAETE was last seen 0/9735 by Dr. Domenic Polite primarily followed for hx of nonobstructive CAD by cath 01/2020, had PVCs during stress test. Not on BB. Baseline HR c/w sinus bradycardia. EF normal. RCRI 0.9% indicating low CV risk. I reached out to patient for update on how he is doing. The patient affirms he has been doing well without any new cardiac symptoms. Still walking 10k steps a day without angina or dyspnea, able to exceed 4 METS. Therefore, based on ACC/AHA guidelines, the patient would be at acceptable risk for the planned procedure without further cardiovascular testing. The patient was advised that if he develops new symptoms prior to surgery to contact our office to arrange for a follow-up visit, and he verbalized understanding.  I will route this recommendation to the requesting party via Epic fax function and remove from pre-op pool. Please call with questions.  Charlie Pitter, PA-C 04/23/2021, 11:41 AM

## 2021-04-25 DIAGNOSIS — M1711 Unilateral primary osteoarthritis, right knee: Secondary | ICD-10-CM | POA: Diagnosis not present

## 2021-04-30 DIAGNOSIS — I10 Essential (primary) hypertension: Secondary | ICD-10-CM | POA: Diagnosis not present

## 2021-04-30 DIAGNOSIS — E7849 Other hyperlipidemia: Secondary | ICD-10-CM | POA: Diagnosis not present

## 2021-04-30 DIAGNOSIS — Z4802 Encounter for removal of sutures: Secondary | ICD-10-CM | POA: Diagnosis not present

## 2021-04-30 DIAGNOSIS — Z01818 Encounter for other preprocedural examination: Secondary | ICD-10-CM | POA: Diagnosis not present

## 2021-05-21 NOTE — Progress Notes (Signed)
Sent message, via epic in basket, requesting orders in epic from surgeon.  

## 2021-05-23 ENCOUNTER — Ambulatory Visit: Payer: Self-pay | Admitting: Physician Assistant

## 2021-05-23 DIAGNOSIS — G8929 Other chronic pain: Secondary | ICD-10-CM

## 2021-05-23 DIAGNOSIS — M1711 Unilateral primary osteoarthritis, right knee: Secondary | ICD-10-CM | POA: Diagnosis not present

## 2021-05-23 NOTE — H&P (View-Only) (Signed)
TOTAL KNEE ADMISSION H&P ? ?Patient is being admitted for right total knee arthroplasty. ? ?Subjective: ? ?Chief Complaint:right knee pain. ? ?HPI: Corey Bruce, 71 y.o. male, has a history of pain and functional disability in the right knee due to arthritis and has failed non-surgical conservative treatments for greater than 12 weeks to includeNSAID's and/or analgesics, corticosteriod injections, viscosupplementation injections, and activity modification.  Onset of symptoms was gradual, starting 6 years ago with gradually worsening course since that time. The patient noted no past surgery on the right knee(s).  Patient currently rates pain in the right knee(s) at 8 out of 10 with activity. Patient has night pain, worsening of pain with activity and weight bearing, pain that interferes with activities of daily living, pain with passive range of motion, crepitus, and joint swelling.  Patient has evidence of periarticular osteophytes and joint space narrowing by imaging studies. There is no active infection. ? ?Patient Active Problem List  ? Diagnosis Date Noted  ? Abnormal myocardial perfusion study   ? Primary localized osteoarthritis of left knee   ? HTN (hypertension)   ? DJD (degenerative joint disease) of knee   ? History of rectal bleeding 01/21/2012  ? H/O hemorrhoids 01/21/2012  ? Encounter for screening colonoscopy for non-high-risk patient 01/21/2012  ? Impingement syndrome of right shoulder 03/11/2011  ? Pain in joint, shoulder region 03/11/2011  ? ?Past Medical History:  ?Diagnosis Date  ? CAD (coronary artery disease)   ? Nonobstructive at cardiac catheterization 01/2020  ? DJD (degenerative joint disease) of knee   ? Left  ? Essential hypertension   ? Hyperlipidemia   ? OSA (obstructive sleep apnea)   ? Osteoarthritis   ?  ?Past Surgical History:  ?Procedure Laterality Date  ? BACK SURGERY    ? lower  ? COLONOSCOPY  02/09/2012  ? Procedure: COLONOSCOPY;  Surgeon: Daneil Dolin, MD;  [facility];  Service: Endoscopy;  Laterality: N/A;  9:30 had to reschd due to ercp case called patient advised of new time 12:30 for 1:30 case patient stated okay/kr   ? HEMORRHOID SURGERY    ? LEFT HEART CATH AND CORONARY ANGIOGRAPHY N/A 01/06/2020  ? Procedure: LEFT HEART CATH AND CORONARY ANGIOGRAPHY;  Surgeon: Troy Sine, MD;  Location: [facility];  Service: Cardiovascular;  Laterality: N/A;  ? PARTIAL KNEE ARTHROPLASTY Left 12/03/2015  ? Procedure: LEFT UNICOMPARTMENTAL KNEE;  Surgeon: Elsie Saas, MD;  Location: [facility];  Service: Orthopedics;  Laterality: Left;  ?  ?Current Outpatient Medications  ?Medication Sig Dispense Refill Last Dose  ? aspirin EC 81 MG tablet Take 81 mg by mouth daily. Swallow whole.      ? atorvastatin (LIPITOR) 10 MG tablet      ? cephALEXin (KEFLEX) 500 MG capsule Take 1 capsule (500 mg total) by mouth 3 (three) times daily. 21 capsule 0   ? ciclopirox (PENLAC) 8 % solution Apply topically at bedtime. Apply over nail and surrounding skin. Apply daily over previous coat. Remove weekly with file or polish remover. 6.6 mL 0   ? fluconazole (DIFLUCAN) 150 MG tablet Take 1 tablet (150 mg total) by mouth once a week. 4 tablet 2   ? olmesartan (BENICAR) 20 MG tablet Take 20 mg by mouth daily.     ? ?Current Facility-Administered Medications  ?Medication Dose Route Frequency Provider Last Rate Last Admin  ? sodium chloride flush (NS) 0.9 % injection 3 mL  3 mL Intravenous Q12H  Satira Sark, MD      ? ?Allergies  ?Allergen Reactions  ? Erythromycin Nausea Only  ?  ORALLY  CAUSED NAUSEA/ IV BURNED VEIN  ?  ?Social History  ? ?Tobacco Use  ? Smoking status: Never  ? Smokeless tobacco: Never  ? Tobacco comments:  ?  Smoked only about a year as teenager  ?Substance Use Topics  ? Alcohol use: No  ?  Comment: remote etoh abuse over 26 years ago  ?  ?Family History  ?Problem Relation Age of Onset  ? Diabetes Other   ? Hypertension Father   ? Heart attack Father    ?     Occurred in his late 36s  ? Thyroid disease Mother   ? Hypercholesterolemia Mother   ? CAD Brother   ? CAD Brother   ? Colon cancer Neg Hx   ?  ? ?Review of Systems  ?Musculoskeletal:  Positive for arthralgias.  ?All other systems reviewed and are negative. ? ?Objective: ? ?Physical Exam ?Constitutional:   ?   General: He is not in acute distress. ?   Appearance: Normal appearance.  ?HENT:  ?   Head: Normocephalic and atraumatic.  ?Eyes:  ?   Extraocular Movements: Extraocular movements intact.  ?   Pupils: Pupils are equal, round, and reactive to light.  ?Cardiovascular:  ?   Rate and Rhythm: Normal rate and regular rhythm.  ?   Pulses: Normal pulses.  ?   Heart sounds: Normal heart sounds.  ?Pulmonary:  ?   Effort: Pulmonary effort is normal. No respiratory distress.  ?   Breath sounds: Normal breath sounds. No wheezing.  ?Abdominal:  ?   General: Abdomen is flat. Bowel sounds are normal. There is no distension.  ?   Palpations: Abdomen is soft.  ?   Tenderness: There is no abdominal tenderness.  ?Musculoskeletal:  ?   Cervical back: Normal range of motion and neck supple.  ?   Right knee: Swelling and bony tenderness present. Tenderness present over the medial joint line.  ?Skin: ?   General: Skin is warm and dry.  ?   Findings: No erythema or rash.  ?Neurological:  ?   General: No focal deficit present.  ?   Mental Status: He is alert and oriented to person, place, and time.  ?Psychiatric:     ?   Mood and Affect: Mood normal.     ?   Behavior: Behavior normal.  ? ? ?Vital signs in last 24 hours: ?'@VSRANGES'$ @ ? ?Labs: ? ? ?Estimated body mass index is 29.97 kg/m? as calculated from the following: ?  Height as of 04/22/21: '6\' 2"'$  (1.88 m). ?  Weight as of 04/22/21: 105.9 kg. ? ? ?Imaging Review ?Plain radiographs demonstrate moderate degenerative joint disease of the right knee(s). The overall alignment issignificant varus. The bone quality appears to be good for age and reported activity  level. ? ? ? ? ? ?Assessment/Plan: ? ?End stage arthritis, right knee  ? ?The patient history, physical examination, clinical judgment of the provider and imaging studies are consistent with end stage degenerative joint disease of the right knee(s) and total knee arthroplasty is deemed medically necessary. The treatment options including medical management, injection therapy arthroscopy and arthroplasty were discussed at length. The risks and benefits of total knee arthroplasty were presented and reviewed. The risks due to aseptic loosening, infection, stiffness, patella tracking problems, thromboembolic complications and other imponderables were discussed. The patient acknowledged the explanation, agreed to  proceed with the plan and consent was signed. Patient is being admitted for inpatient treatment for surgery, pain control, PT, OT, prophylactic antibiotics, VTE prophylaxis, progressive ambulation and ADL's and discharge planning. The patient is planning to be discharged  home with outpt PT. ? ? ? ?Anticipated LOS equal to or greater than 2 midnights due to ?- Age 21 and older with one or more of the following: ? - Obesity ? - Expected need for hospital services (PT, OT, Nursing) required for safe  discharge ? - Anticipated need for postoperative skilled nursing care or inpatient rehab ? - Active co-morbidities: Coronary Artery Disease ?OR  ? ?- Unanticipated findings during/Post Surgery: None  ?- Patient is a high risk of re-admission due to: None ?

## 2021-05-23 NOTE — H&P (Signed)
TOTAL KNEE ADMISSION H&P ? ?Patient is being admitted for right total knee arthroplasty. ? ?Subjective: ? ?Chief Complaint:right knee pain. ? ?HPI: Corey Bruce, 71 y.o. male, has a history of pain and functional disability in the right knee due to arthritis and has failed non-surgical conservative treatments for greater than 12 weeks to includeNSAID's and/or analgesics, corticosteriod injections, viscosupplementation injections, and activity modification.  Onset of symptoms was gradual, starting 6 years ago with gradually worsening course since that time. The patient noted no past surgery on the right knee(s).  Patient currently rates pain in the right knee(s) at 8 out of 10 with activity. Patient has night pain, worsening of pain with activity and weight bearing, pain that interferes with activities of daily living, pain with passive range of motion, crepitus, and joint swelling.  Patient has evidence of periarticular osteophytes and joint space narrowing by imaging studies. There is no active infection. ? ?Patient Active Problem List  ? Diagnosis Date Noted  ? Abnormal myocardial perfusion study   ? Primary localized osteoarthritis of left knee   ? HTN (hypertension)   ? DJD (degenerative joint disease) of knee   ? History of rectal bleeding 01/21/2012  ? H/O hemorrhoids 01/21/2012  ? Encounter for screening colonoscopy for non-high-risk patient 01/21/2012  ? Impingement syndrome of right shoulder 03/11/2011  ? Pain in joint, shoulder region 03/11/2011  ? ?Past Medical History:  ?Diagnosis Date  ? CAD (coronary artery disease)   ? Nonobstructive at cardiac catheterization 01/2020  ? DJD (degenerative joint disease) of knee   ? Left  ? Essential hypertension   ? Hyperlipidemia   ? OSA (obstructive sleep apnea)   ? Osteoarthritis   ?  ?Past Surgical History:  ?Procedure Laterality Date  ? BACK SURGERY    ? lower  ? COLONOSCOPY  02/09/2012  ? Procedure: COLONOSCOPY;  Surgeon: Daneil Dolin, MD;  [facility];  Service: Endoscopy;  Laterality: N/A;  9:30 had to reschd due to ercp case called patient advised of new time 12:30 for 1:30 case patient stated okay/kr   ? HEMORRHOID SURGERY    ? LEFT HEART CATH AND CORONARY ANGIOGRAPHY N/A 01/06/2020  ? Procedure: LEFT HEART CATH AND CORONARY ANGIOGRAPHY;  Surgeon: Troy Sine, MD;  Location: [facility];  Service: Cardiovascular;  Laterality: N/A;  ? PARTIAL KNEE ARTHROPLASTY Left 12/03/2015  ? Procedure: LEFT UNICOMPARTMENTAL KNEE;  Surgeon: Elsie Saas, MD;  Location: [facility];  Service: Orthopedics;  Laterality: Left;  ?  ?Current Outpatient Medications  ?Medication Sig Dispense Refill Last Dose  ? aspirin EC 81 MG tablet Take 81 mg by mouth daily. Swallow whole.      ? atorvastatin (LIPITOR) 10 MG tablet      ? cephALEXin (KEFLEX) 500 MG capsule Take 1 capsule (500 mg total) by mouth 3 (three) times daily. 21 capsule 0   ? ciclopirox (PENLAC) 8 % solution Apply topically at bedtime. Apply over nail and surrounding skin. Apply daily over previous coat. Remove weekly with file or polish remover. 6.6 mL 0   ? fluconazole (DIFLUCAN) 150 MG tablet Take 1 tablet (150 mg total) by mouth once a week. 4 tablet 2   ? olmesartan (BENICAR) 20 MG tablet Take 20 mg by mouth daily.     ? ?Current Facility-Administered Medications  ?Medication Dose Route Frequency Provider Last Rate Last Admin  ? sodium chloride flush (NS) 0.9 % injection 3 mL  3 mL Intravenous Q12H  Satira Sark, MD      ? ?Allergies  ?Allergen Reactions  ? Erythromycin Nausea Only  ?  ORALLY  CAUSED NAUSEA/ IV BURNED VEIN  ?  ?Social History  ? ?Tobacco Use  ? Smoking status: Never  ? Smokeless tobacco: Never  ? Tobacco comments:  ?  Smoked only about a year as teenager  ?Substance Use Topics  ? Alcohol use: No  ?  Comment: remote etoh abuse over 26 years ago  ?  ?Family History  ?Problem Relation Age of Onset  ? Diabetes Other   ? Hypertension Father   ? Heart attack Father    ?     Occurred in his late 79s  ? Thyroid disease Mother   ? Hypercholesterolemia Mother   ? CAD Brother   ? CAD Brother   ? Colon cancer Neg Hx   ?  ? ?Review of Systems  ?Musculoskeletal:  Positive for arthralgias.  ?All other systems reviewed and are negative. ? ?Objective: ? ?Physical Exam ?Constitutional:   ?   General: He is not in acute distress. ?   Appearance: Normal appearance.  ?HENT:  ?   Head: Normocephalic and atraumatic.  ?Eyes:  ?   Extraocular Movements: Extraocular movements intact.  ?   Pupils: Pupils are equal, round, and reactive to light.  ?Cardiovascular:  ?   Rate and Rhythm: Normal rate and regular rhythm.  ?   Pulses: Normal pulses.  ?   Heart sounds: Normal heart sounds.  ?Pulmonary:  ?   Effort: Pulmonary effort is normal. No respiratory distress.  ?   Breath sounds: Normal breath sounds. No wheezing.  ?Abdominal:  ?   General: Abdomen is flat. Bowel sounds are normal. There is no distension.  ?   Palpations: Abdomen is soft.  ?   Tenderness: There is no abdominal tenderness.  ?Musculoskeletal:  ?   Cervical back: Normal range of motion and neck supple.  ?   Right knee: Swelling and bony tenderness present. Tenderness present over the medial joint line.  ?Skin: ?   General: Skin is warm and dry.  ?   Findings: No erythema or rash.  ?Neurological:  ?   General: No focal deficit present.  ?   Mental Status: He is alert and oriented to person, place, and time.  ?Psychiatric:     ?   Mood and Affect: Mood normal.     ?   Behavior: Behavior normal.  ? ? ?Vital signs in last 24 hours: ?'@VSRANGES'$ @ ? ?Labs: ? ? ?Estimated body mass index is 29.97 kg/m? as calculated from the following: ?  Height as of 04/22/21: '6\' 2"'$  (1.88 m). ?  Weight as of 04/22/21: 105.9 kg. ? ? ?Imaging Review ?Plain radiographs demonstrate moderate degenerative joint disease of the right knee(s). The overall alignment issignificant varus. The bone quality appears to be good for age and reported activity  level. ? ? ? ? ? ?Assessment/Plan: ? ?End stage arthritis, right knee  ? ?The patient history, physical examination, clinical judgment of the provider and imaging studies are consistent with end stage degenerative joint disease of the right knee(s) and total knee arthroplasty is deemed medically necessary. The treatment options including medical management, injection therapy arthroscopy and arthroplasty were discussed at length. The risks and benefits of total knee arthroplasty were presented and reviewed. The risks due to aseptic loosening, infection, stiffness, patella tracking problems, thromboembolic complications and other imponderables were discussed. The patient acknowledged the explanation, agreed to  proceed with the plan and consent was signed. Patient is being admitted for inpatient treatment for surgery, pain control, PT, OT, prophylactic antibiotics, VTE prophylaxis, progressive ambulation and ADL's and discharge planning. The patient is planning to be discharged  home with outpt PT. ? ? ? ?Anticipated LOS equal to or greater than 2 midnights due to ?- Age 71 and older with one or more of the following: ? - Obesity ? - Expected need for hospital services (PT, OT, Nursing) required for safe  discharge ? - Anticipated need for postoperative skilled nursing care or inpatient rehab ? - Active co-morbidities: Coronary Artery Disease ?OR  ? ?- Unanticipated findings during/Post Surgery: None  ?- Patient is a high risk of re-admission due to: None ?

## 2021-05-30 NOTE — Progress Notes (Addendum)
Anesthesia Review: ? ?PCP: DR Wende Neighbors  ?Cardiologist : DR Johnny Bridge  LOV 08/06/20  ?Clearance- 04/22/21- Dayna dunn,PAC  ?Chest x-ray : ?EKG : 06/03/21  ?Echo :2018  ?Stress test:2021  ?Cardiac Cath : 2021  ?Activity level:  can do a flight of stairs without difficulty  ?Sleep Study/ CPAP : has sleep apnea could not tolerate cpap is claustrophobic  ?Fasting Blood Sugar :      / Checks Blood Sugar -- times a day:   ?Blood Thinner/ Instructions /Last Dose: ?ASA / Instructions/ Last Dose :   ?81 mg Aspirin  ?At preop appt bp was 151/103.  Pt denies any chest pain, shortness of breath , blurred vision or headache.  REcheck of blood pressure at end of preop appt was 136/91 ?

## 2021-05-30 NOTE — Progress Notes (Signed)
DUE TO COVID-19 ONLY 2 VISITOR IS ALLOWED TO COME WITH YOU AND STAY IN THE WAITING ROOM ONLY DURING PRE OP AND PROCEDURE DAY OF SURGERY.  4 VISITOR  MAY VISIT WITH YOU AFTER SURGERY IN YOUR PRIVATE ROOM DURING VISITING HOURS ONLY! ?YOU MAY HAVE ONE PERSON SPEND THE NITE WITH YOU IN YOUR ROOM AFTER SURGERY.   ? ? ? Your procedure is scheduled on:  ?  06/12/2021  ? Report to [facility] Main  Entrance ? ? Report to admitting at     0600           AM ?DO NOT BRING INSURANCE CARD, PICTURE ID OR WALLET DAY OF SURGERY.  ?  ? ? Call this number if you have problems the morning of surgery [phone]  ? ? REMEMBER: NO  SOLID FOODS , CANDY, GUM OR MINTS AFTER MIDNITE THE NITE BEFORE SURGERY .       Marland Kitchen CLEAR LIQUIDS UNTIL      0530am           DAY OF SURGERY.      PLEASE FINISH ENSURE DRINK PER SURGEON ORDER  WHICH NEEDS TO BE COMPLETED AT  0530am        MORNING OF SURGERY.   ? ? ? ? ?CLEAR LIQUID DIET ? ? ?Foods Allowed      ?WATER ?BLACK COFFEE ( SUGAR OK, NO MILK, CREAM OR CREAMER) REGULAR AND DECAF  ?TEA ( SUGAR OK NO MILK, CREAM, OR CREAMER) REGULAR AND DECAF  ?PLAIN JELLO ( NO RED)  ?FRUIT ICES ( NO RED, NO FRUIT PULP)  ?POPSICLES ( NO RED)  ?JUICE- APPLE, WHITE GRAPE AND WHITE CRANBERRY  ?SPORT DRINK LIKE GATORADE ( NO RED)  ?CLEAR BROTH ( VEGETABLE , CHICKEN OR BEEF)                                                               ? ?    ? ?BRUSH YOUR TEETH MORNING OF SURGERY AND RINSE YOUR MOUTH OUT, NO CHEWING GUM CANDY OR MINTS. ?  ? ? Take these medicines the morning of surgery with A SIP OF WATER:  none  ? ? ?DO NOT TAKE ANY DIABETIC MEDICATIONS DAY OF YOUR SURGERY ?                  ?            You may not have any metal on your body including hair pins and  ?            piercings  Do not wear jewelry, make-up, lotions, powders or perfumes, deodorant ?            Do not wear nail polish on your fingernails.   ?           IF YOU ARE A MALE AND WANT TO SHAVE UNDER ARMS OR LEGS PRIOR TO SURGERY YOU MUST DO  SO AT LEAST 48 HOURS PRIOR TO SURGERY.  ?            Men may shave face and neck. ? ? Do not bring valuables to the hospital. [facility] NOT ?            RESPONSIBLE   FOR VALUABLES. ?  Contacts, dentures or bridgework may not be worn into surgery. ? Leave suitcase in the car. After surgery it may be brought to your room. ? ?  ? Patients discharged the day of surgery will not be allowed to drive home. IF YOU ARE HAVING SURGERY AND GOING HOME THE SAME DAY, YOU MUST HAVE AN ADULT TO DRIVE YOU HOME AND BE WITH YOU FOR 24 HOURS. YOU MAY GO HOME BY TAXI OR UBER OR ORTHERWISE, BUT AN ADULT MUST ACCOMPANY YOU HOME AND STAY WITH YOU FOR 24 HOURS. ?  ? ?            Please read over the following fact sheets you were given: ?_____________________________________________________________________ ? ?[facility] - Preparing for Surgery ?Before surgery, you can play an important role.  Because skin is not sterile, your skin needs to be as free of germs as possible.  You can reduce the number of germs on your skin by washing with CHG (chlorahexidine gluconate) soap before surgery.  CHG is an antiseptic cleaner which kills germs and bonds with the skin to continue killing germs even after washing. ?Please DO NOT use if you have an allergy to CHG or antibacterial soaps.  If your skin becomes reddened/irritated stop using the CHG and inform your nurse when you arrive at Short Stay. ?Do not shave (including legs and underarms) for at least 48 hours prior to the first CHG shower.  You may shave your face/neck. ?Please follow these instructions carefully: ? 1.  Shower with CHG Soap the night before surgery and the  morning of Surgery. ? 2.  If you choose to wash your hair, wash your hair first as usual with your  normal  shampoo. ? 3.  After you shampoo, rinse your hair and body thoroughly to remove the  shampoo.                           4.  Use CHG as you would any other liquid soap.  You can apply chg directly  to the skin and wash   ?                     Gently with a scrungie or clean washcloth. ? 5.  Apply the CHG Soap to your body ONLY FROM THE NECK DOWN.   Do not use on face/ open      ?                     Wound or open sores. Avoid contact with eyes, ears mouth and genitals (private parts).  ?                     Production manager,  Genitals (private parts) with your normal soap. ?            6.  Wash thoroughly, paying special attention to the area where your surgery  will be performed. ? 7.  Thoroughly rinse your body with warm water from the neck down. ? 8.  DO NOT shower/wash with your normal soap after using and rinsing off  the CHG Soap. ?               9.  Pat yourself dry with a clean towel. ?           10.  Wear clean pajamas. ?           11.  Place clean sheets on your bed the night of your first shower and do not  sleep with pets. ?Day of Surgery : ?Do not apply any lotions/deodorants the morning of surgery.  Please wear clean clothes to the hospital/surgery center. ? ?FAILURE TO FOLLOW THESE INSTRUCTIONS MAY RESULT IN THE CANCELLATION OF YOUR SURGERY ?PATIENT SIGNATURE_________________________________ ? ?NURSE SIGNATURE__________________________________ ? ?________________________________________________________________________  ? ? ?           ?

## 2021-06-03 ENCOUNTER — Encounter (HOSPITAL_COMMUNITY)
Admission: RE | Admit: 2021-06-03 | Discharge: 2021-06-03 | Disposition: A | Discharge status: Home / Self Care | Payer: Medicare Other | Source: Ambulatory Visit | Attending: Orthopedic Surgery | Admitting: Orthopedic Surgery

## 2021-06-03 ENCOUNTER — Other Ambulatory Visit: Payer: Self-pay

## 2021-06-03 ENCOUNTER — Encounter (HOSPITAL_COMMUNITY): Payer: Self-pay

## 2021-06-03 VITALS — BP 136/91 | HR 56 | Temp 97.9°F | Resp 16 | Ht 74.0 in | Wt 222.0 lb

## 2021-06-03 DIAGNOSIS — I2510 Atherosclerotic heart disease of native coronary artery without angina pectoris: Secondary | ICD-10-CM | POA: Insufficient documentation

## 2021-06-03 DIAGNOSIS — G4733 Obstructive sleep apnea (adult) (pediatric): Secondary | ICD-10-CM | POA: Diagnosis not present

## 2021-06-03 DIAGNOSIS — Z01818 Encounter for other preprocedural examination: Secondary | ICD-10-CM

## 2021-06-03 DIAGNOSIS — M1711 Unilateral primary osteoarthritis, right knee: Secondary | ICD-10-CM | POA: Diagnosis not present

## 2021-06-03 DIAGNOSIS — I10 Essential (primary) hypertension: Secondary | ICD-10-CM | POA: Insufficient documentation

## 2021-06-03 DIAGNOSIS — G8929 Other chronic pain: Secondary | ICD-10-CM

## 2021-06-03 LAB — COMPREHENSIVE METABOLIC PANEL
ALT: 26 U/L (ref 0–44)
AST: 25 U/L (ref 15–41)
Albumin: 4.3 g/dL (ref 3.5–5.0)
Alkaline Phosphatase: 55 U/L (ref 38–126)
Anion gap: 9 (ref 5–15)
BUN: 13 mg/dL (ref 8–23)
CO2: 24 mmol/L (ref 22–32)
Calcium: 9.1 mg/dL (ref 8.9–10.3)
Chloride: 106 mmol/L (ref 98–111)
Creatinine, Ser: 0.67 mg/dL (ref 0.61–1.24)
GFR, Estimated: >60 mL/min (ref >60)
Glucose, Bld: 82 mg/dL (ref 70–99)
Potassium: 3.9 mmol/L (ref 3.5–5.1)
Sodium: 139 mmol/L (ref 135–145)
Total Bilirubin: 0.8 mg/dL (ref 0.3–1.2)
Total Protein: 7.2 g/dL (ref 6.5–8.1)

## 2021-06-03 LAB — CBC WITH DIFFERENTIAL/PLATELET
Abs Immature Granulocytes: 0.02 10*3/uL (ref 0.00–0.07)
Basophils Absolute: 0.1 10*3/uL (ref 0.0–0.1)
Basophils Relative: 1 %
Eosinophils Absolute: 0.3 10*3/uL (ref 0.0–0.5)
Eosinophils Relative: 4 %
HCT: 45.2 % (ref 39.0–52.0)
Hemoglobin: 15.2 g/dL (ref 13.0–17.0)
Immature Granulocytes: 0 %
Lymphocytes Relative: 18 %
Lymphs Abs: 1.3 10*3/uL (ref 0.7–4.0)
MCH: 30.8 pg (ref 26.0–34.0)
MCHC: 33.6 g/dL (ref 30.0–36.0)
MCV: 91.5 fL (ref 80.0–100.0)
Monocytes Absolute: 0.8 10*3/uL (ref 0.1–1.0)
Monocytes Relative: 11 %
Neutro Abs: 5 10*3/uL (ref 1.7–7.7)
Neutrophils Relative %: 66 %
Platelets: 195 10*3/uL (ref 150–400)
RBC: 4.94 MIL/uL (ref 4.22–5.81)
RDW: 13.2 % (ref 11.5–15.5)
WBC: 7.5 10*3/uL (ref 4.0–10.5)
nRBC: 0 % (ref 0.0–0.2)

## 2021-06-03 LAB — SURGICAL PCR SCREEN
MRSA, PCR: NEGATIVE
Staphylococcus aureus: NEGATIVE

## 2021-06-04 DIAGNOSIS — I10 Essential (primary) hypertension: Secondary | ICD-10-CM | POA: Diagnosis not present

## 2021-06-04 DIAGNOSIS — R7303 Prediabetes: Secondary | ICD-10-CM | POA: Diagnosis not present

## 2021-06-04 NOTE — Care Plan (Signed)
Ortho Bundle Case Management Note ? ?Patient Details  ?Name: Corey Bruce ?MRN: [id number] ?Date of Birth: 11/15/1950 ? ?  Met with patient in the office prior to surgery. He will discharge to home with family to assist. Rolling walker and CPM ordered for home use. OPPT set up with [facility]. Patient and MD in agreement with plan. Choice offered.               ? ? ? ?DME Arranged:  Walker rolling, CPM ?DME Agency:  [employer] ? ?HH Arranged:    ?[facility] Agency:    ? ?Additional Comments: ?Please contact me with any questions of if this plan should need to change. ? ?Mardelle Matte  [facility] Orthopaedic Specialist  [phone] ?06/04/2021, 9:45 AM ?  ?

## 2021-06-04 NOTE — Progress Notes (Signed)
Anesthesia Chart Review ? ? Case: [id number] Date/Time: 06/12/21 0815  ? Procedure: UNICOMPARTMENTAL KNEE (Right: Knee)  ? Anesthesia type: Spinal  ? Pre-op diagnosis: OA RIGHT KNEE  ? Location: [facility] ROOM 09 / [facility]  ? Surgeons: Willaim Sheng, MD  ? ?  ? ? ?DISCUSSION:71 y.o. never smoker with h/o HTN, OSA, nonobstructive CAD, right knee OA scheduled for above procedure 06/12/2021 with Dr. Charlies Constable  ? ?Per cardiology preoperative evaluation 04/23/21, "Chart reviewed as part of pre-operative protocol coverage. Patient was contacted 04/23/2021 in reference to pre-operative risk assessment for pending surgery as outlined below.  Corey Bruce was last seen 08/7589 by Dr. Domenic Polite primarily followed for hx of nonobstructive CAD by cath 01/2020, had PVCs during stress test. Not on BB. Baseline HR c/w sinus bradycardia. EF normal. RCRI 0.9% indicating low CV risk. I reached out to patient for update on how he is doing. The patient affirms he has been doing well without any new cardiac symptoms. Still walking 10k steps a day without angina or dyspnea, able to exceed 4 METS. Therefore, based on ACC/AHA guidelines, the patient would be at acceptable risk for the planned procedure without further cardiovascular testing. The patient was advised that if he develops new symptoms prior to surgery to contact our office to arrange for a follow-up visit, and he verbalized understanding." ? ?Anticipate pt can proceed with planned procedure barring acute status change.   ?VS: BP (!) 136/91   Pulse (!) 56   Temp 36.6 ?C (Oral)   Resp 16   Ht '6\' 2"'$  (1.88 m)   Wt 100.7 kg   SpO2 100%   BMI 28.50 kg/m?  ? ?PROVIDERS: ?Celene Squibb, MD is PCP  ? ?Primary Cardiologist: Rozann Lesches, MD ?LABS: Labs reviewed: Acceptable for surgery. ?(all labs ordered are listed, but only abnormal results are displayed) ? ?Labs Reviewed  ?SURGICAL PCR SCREEN  ?COMPREHENSIVE METABOLIC PANEL  ?CBC WITH DIFFERENTIAL/PLATELET  ?TYPE AND  SCREEN  ? ? ? ?IMAGES: ? ? ?EKG: ? ? ?CV: ?Cardiac Cath 01/06/2020 ?Prox RCA lesion is 20% stenosed. ?Dist RCA lesion is 20% stenosed. ?Mid LAD lesion is 50% stenosed. ?2nd Diag lesion is 15% stenosed. ?The left ventricular systolic function is normal. ?LV end diastolic pressure is normal. ?The left ventricular ejection fraction is 55-65% by visual estimate. ?  ?Mild to moderate  CAD with smooth 50% LAD stenosis after the second diagonal vessel with mild 10 to 15% narrowing at the ostium of the diagonal vessel.  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  Systolic bridging improved following intracoronary nitroglycerin administration.  Normal left circumflex coronary artery.  Dominant RCA with mild 20% mid and distal smooth narrowing. ?  ?Normal LV contractility without wall motion abnormality and EF estimate at 60%.  LVEDP 8 mmHg. ? ?Echo 05/16/2016 ?Study Conclusions  ? ?- Stress ECG conclusions: Frequent ventricular ectopy. [facility]  ?  scoring: exercise time of 8.5 min; maximum ST deviation of 0 mm;  ?  no angina; resulting score is 9. This score predicts a low risk  ?  of cardiac events.  ?- Staged echo: Resting LV systolic function was normal, LVEF  ?  60-65%. With stress, there was a normal hypercontractile response  ?  seen with augmentation of all wall segments, LVEF 75-80%. No  ?  inducible ischemia noted. Normal echo stress  ? ?Impressions:  ? ?- Normal study after maximal exercise.  ?Past Medical History:  ?Diagnosis Date  ? CAD (  coronary artery disease)   ? Nonobstructive at cardiac catheterization 01/2020  ? DJD (degenerative joint disease) of knee   ? Left  ? Essential hypertension   ? Hyperlipidemia   ? OSA (obstructive sleep apnea)   ? no cpap  ? Osteoarthritis   ? ? ?Past Surgical History:  ?Procedure Laterality Date  ? BACK SURGERY    ? lower  ? COLONOSCOPY  02/09/2012  ? Procedure: COLONOSCOPY;  Surgeon: Daneil Dolin, MD;  [facility];  Service: Endoscopy;  Laterality: N/A;  9:30 had  to reschd due to ercp case called patient advised of new time 12:30 for 1:30 case patient stated okay/kr   ? HEMORRHOID SURGERY    ? LEFT HEART CATH AND CORONARY ANGIOGRAPHY N/A 01/06/2020  ? Procedure: LEFT HEART CATH AND CORONARY ANGIOGRAPHY;  Surgeon: Troy Sine, MD;  Location: [facility];  Service: Cardiovascular;  Laterality: N/A;  ? PARTIAL KNEE ARTHROPLASTY Left 12/03/2015  ? Procedure: LEFT UNICOMPARTMENTAL KNEE;  Surgeon: Elsie Saas, MD;  Location: [facility];  Service: Orthopedics;  Laterality: Left;  ? ? ?MEDICATIONS: ? aspirin EC 81 MG tablet  ? atorvastatin (LIPITOR) 10 MG tablet  ? cephALEXin (KEFLEX) 500 MG capsule  ? ciclopirox (PENLAC) 8 % solution  ? Multiple Vitamins-Minerals (MULTIVITAMIN ADULT) CHEW  ? olmesartan (BENICAR) 20 MG tablet  ? ? sodium chloride flush (NS) 0.9 % injection 3 mL  ? ? ?Corey Bouillon Ward, PA-C ?[facility] Pre-Surgical Testing ?[phone] ? ? ? ? ? ? ?

## 2021-06-11 DIAGNOSIS — M179 Osteoarthritis of knee, unspecified: Secondary | ICD-10-CM | POA: Diagnosis not present

## 2021-06-11 DIAGNOSIS — Z0001 Encounter for general adult medical examination with abnormal findings: Secondary | ICD-10-CM | POA: Diagnosis not present

## 2021-06-11 DIAGNOSIS — R7303 Prediabetes: Secondary | ICD-10-CM | POA: Diagnosis not present

## 2021-06-11 DIAGNOSIS — I2510 Atherosclerotic heart disease of native coronary artery without angina pectoris: Secondary | ICD-10-CM | POA: Diagnosis not present

## 2021-06-11 DIAGNOSIS — Z8249 Family history of ischemic heart disease and other diseases of the circulatory system: Secondary | ICD-10-CM | POA: Diagnosis not present

## 2021-06-11 DIAGNOSIS — I10 Essential (primary) hypertension: Secondary | ICD-10-CM | POA: Diagnosis not present

## 2021-06-11 DIAGNOSIS — E782 Mixed hyperlipidemia: Secondary | ICD-10-CM | POA: Diagnosis not present

## 2021-06-11 MED ORDER — TRANEXAMIC ACID 1000 MG/10ML IV SOLN
2000.0000 mg | INTRAVENOUS | Status: DC
  Filled 2021-06-11: qty 20

## 2021-06-11 NOTE — Anesthesia Preprocedure Evaluation (Addendum)
Anesthesia Evaluation  ?Patient identified by MRN, date of birth, ID band ?Patient awake ? ? ? ?Reviewed: ?Allergy & Precautions, NPO status , Patient's Chart, lab work & pertinent test results ? ?History of Anesthesia Complications ?Negative for: history of anesthetic complications ? ?Airway ?Mallampati: I ? ?TM Distance: >3 FB ?Neck ROM: Full ? ? ? Dental ? ?(+) Dental Advisory Given ?  ?Pulmonary ?sleep apnea (does not use CPAP) ,  ?  ?breath sounds clear to auscultation ? ? ? ? ? ? Cardiovascular ?hypertension, Pt. on medications ?(-) angina+ CAD  ? ?Rhythm:Regular Rate:Normal ? ?'21 Cath: mild-mod ASCAD, normal LVF, EF 60% ?  ?Neuro/Psych ?negative neurological ROS ?   ? GI/Hepatic ?negative GI ROS, Neg liver ROS,   ?Endo/Other  ?negative endocrine ROS ? Renal/GU ?negative Renal ROS  ? ?  ?Musculoskeletal ? ? Abdominal ?  ?Peds ? Hematology ?negative hematology ROS ?(+)   ?Anesthesia Other Findings ? ? Reproductive/Obstetrics ? ?  ? ? ? ? ? ? ? ? ? ? ? ? ? ?  ?  ? ? ? ? ? ? ? ?Anesthesia Physical ?Anesthesia Plan ? ?ASA: 3 ? ?Anesthesia Plan: Spinal  ? ?Post-op Pain Management: Tylenol PO (pre-op)* and Regional block*  ? ?Induction:  ? ?PONV Risk Score and Plan: 1 and Ondansetron ? ?Airway Management Planned: Natural Airway and Simple Face Mask ? ?Additional Equipment: None ? ?Intra-op Plan:  ? ?Post-operative Plan:  ? ?Informed Consent: I have reviewed the patients History and Physical, chart, labs and discussed the procedure including the risks, benefits and alternatives for the proposed anesthesia with the patient or authorized representative who has indicated his/her understanding and acceptance.  ? ? ? ?Dental advisory given ? ?Plan Discussed with: CRNA and Surgeon ? ?Anesthesia Plan Comments:   ? ? ? ? ? ?Anesthesia Quick Evaluation ? ?

## 2021-06-12 ENCOUNTER — Other Ambulatory Visit: Payer: Self-pay

## 2021-06-12 ENCOUNTER — Ambulatory Visit (HOSPITAL_BASED_OUTPATIENT_CLINIC_OR_DEPARTMENT_OTHER): Payer: Medicare Other | Admitting: Anesthesiology

## 2021-06-12 ENCOUNTER — Ambulatory Visit (HOSPITAL_COMMUNITY): Payer: Medicare Other | Admitting: Physician Assistant

## 2021-06-12 ENCOUNTER — Encounter (HOSPITAL_COMMUNITY): Payer: Self-pay | Admitting: Orthopedic Surgery

## 2021-06-12 ENCOUNTER — Encounter (HOSPITAL_COMMUNITY)
Admission: RE | Disposition: A | Discharge status: Home / Self Care | Payer: Self-pay | Source: Home / Self Care | Attending: Orthopedic Surgery

## 2021-06-12 ENCOUNTER — Ambulatory Visit (HOSPITAL_COMMUNITY): Payer: Medicare Other

## 2021-06-12 ENCOUNTER — Ambulatory Visit (HOSPITAL_COMMUNITY)
Admission: RE | Admit: 2021-06-12 | Discharge: 2021-06-12 | Disposition: A | Discharge status: Home / Self Care | Payer: Medicare Other | Attending: Orthopedic Surgery | Admitting: Orthopedic Surgery

## 2021-06-12 DIAGNOSIS — R600 Localized edema: Secondary | ICD-10-CM | POA: Diagnosis not present

## 2021-06-12 DIAGNOSIS — Z471 Aftercare following joint replacement surgery: Secondary | ICD-10-CM | POA: Diagnosis not present

## 2021-06-12 DIAGNOSIS — Z79899 Other long term (current) drug therapy: Secondary | ICD-10-CM | POA: Insufficient documentation

## 2021-06-12 DIAGNOSIS — G4730 Sleep apnea, unspecified: Secondary | ICD-10-CM

## 2021-06-12 DIAGNOSIS — I2510 Atherosclerotic heart disease of native coronary artery without angina pectoris: Secondary | ICD-10-CM | POA: Insufficient documentation

## 2021-06-12 DIAGNOSIS — Z8249 Family history of ischemic heart disease and other diseases of the circulatory system: Secondary | ICD-10-CM | POA: Insufficient documentation

## 2021-06-12 DIAGNOSIS — Z6828 Body mass index (BMI) 28.0-28.9, adult: Secondary | ICD-10-CM | POA: Insufficient documentation

## 2021-06-12 DIAGNOSIS — I10 Essential (primary) hypertension: Secondary | ICD-10-CM | POA: Diagnosis not present

## 2021-06-12 DIAGNOSIS — M1711 Unilateral primary osteoarthritis, right knee: Secondary | ICD-10-CM

## 2021-06-12 DIAGNOSIS — Z96651 Presence of right artificial knee joint: Secondary | ICD-10-CM | POA: Diagnosis not present

## 2021-06-12 DIAGNOSIS — E669 Obesity, unspecified: Secondary | ICD-10-CM | POA: Insufficient documentation

## 2021-06-12 DIAGNOSIS — Z9889 Other specified postprocedural states: Secondary | ICD-10-CM | POA: Diagnosis not present

## 2021-06-12 DIAGNOSIS — G8918 Other acute postprocedural pain: Secondary | ICD-10-CM | POA: Diagnosis not present

## 2021-06-12 DIAGNOSIS — G4733 Obstructive sleep apnea (adult) (pediatric): Secondary | ICD-10-CM | POA: Insufficient documentation

## 2021-06-12 HISTORY — PX: PARTIAL KNEE ARTHROPLASTY: SHX2174

## 2021-06-12 LAB — TYPE AND SCREEN
ABO/RH(D): B POS
Antibody Screen: NEGATIVE

## 2021-06-12 LAB — ABO/RH: ABO/RH(D): B POS

## 2021-06-12 SURGERY — ARTHROPLASTY, KNEE, UNICOMPARTMENTAL
Anesthesia: Spinal | Site: Knee | Laterality: Right

## 2021-06-12 MED ORDER — ACETAMINOPHEN 500 MG PO TABS
1000.0000 mg | ORAL_TABLET | Freq: Once | ORAL | Status: AC
  Administered 2021-06-12: 1000 mg via ORAL
  Filled 2021-06-12: qty 2

## 2021-06-12 MED ORDER — CEFAZOLIN SODIUM-DEXTROSE 1-4 GM/50ML-% IV SOLN
INTRAVENOUS | Status: AC
  Filled 2021-06-12: qty 50

## 2021-06-12 MED ORDER — SODIUM CHLORIDE 0.9 % IV SOLN: INTRAVENOUS | Status: DC

## 2021-06-12 MED ORDER — HYDROMORPHONE HCL 1 MG/ML IJ SOLN: 0.2500 mg | INTRAMUSCULAR | Status: DC | PRN

## 2021-06-12 MED ORDER — 0.9 % SODIUM CHLORIDE (POUR BTL) OPTIME
TOPICAL | Status: DC | PRN
  Administered 2021-06-12: 1000 mL

## 2021-06-12 MED ORDER — ONDANSETRON HCL 4 MG/2ML IJ SOLN
INTRAMUSCULAR | Status: DC | PRN
  Administered 2021-06-12: 4 mg via INTRAVENOUS

## 2021-06-12 MED ORDER — BUPIVACAINE LIPOSOME 1.3 % IJ SUSP: 20.0000 mL | Freq: Once | INTRAMUSCULAR | Status: DC

## 2021-06-12 MED ORDER — PROPOFOL 10 MG/ML IV BOLUS
INTRAVENOUS | Status: DC | PRN
Start: 2021-06-12 — End: 2021-06-12
  Administered 2021-06-12: 20 mg via INTRAVENOUS

## 2021-06-12 MED ORDER — PROPOFOL 500 MG/50ML IV EMUL
INTRAVENOUS | Status: AC
  Filled 2021-06-12: qty 50

## 2021-06-12 MED ORDER — DEXAMETHASONE SODIUM PHOSPHATE 10 MG/ML IJ SOLN
8.0000 mg | Freq: Once | INTRAMUSCULAR | Status: AC
  Administered 2021-06-12: 10 mg via INTRAVENOUS

## 2021-06-12 MED ORDER — PROPOFOL 500 MG/50ML IV EMUL
INTRAVENOUS | Status: DC | PRN
Start: 2021-06-12 — End: 2021-06-12
  Administered 2021-06-12: 125 ug/kg/min via INTRAVENOUS

## 2021-06-12 MED ORDER — MIDAZOLAM HCL 5 MG/5ML IJ SOLN
INTRAMUSCULAR | Status: DC | PRN
Start: 2021-06-12 — End: 2021-06-12
  Administered 2021-06-12 (×2): 1 mg via INTRAVENOUS

## 2021-06-12 MED ORDER — FENTANYL CITRATE (PF) 100 MCG/2ML IJ SOLN
INTRAMUSCULAR | Status: AC
  Filled 2021-06-12: qty 2

## 2021-06-12 MED ORDER — SODIUM CHLORIDE (PF) 0.9 % IJ SOLN
INTRAMUSCULAR | Status: AC
  Filled 2021-06-12: qty 50

## 2021-06-12 MED ORDER — ASPIRIN EC 81 MG PO TBEC: 81.0000 mg | DELAYED_RELEASE_TABLET | Freq: Two times a day (BID) | ORAL | 0 refills | Status: AC

## 2021-06-12 MED ORDER — CEFAZOLIN SODIUM-DEXTROSE 2-4 GM/100ML-% IV SOLN: 2.0000 g | Freq: Four times a day (QID) | INTRAVENOUS | Status: DC

## 2021-06-12 MED ORDER — PROPOFOL 1000 MG/100ML IV EMUL
INTRAVENOUS | Status: AC
  Filled 2021-06-12: qty 100

## 2021-06-12 MED ORDER — CELECOXIB 200 MG PO CAPS
400.0000 mg | ORAL_CAPSULE | Freq: Once | ORAL | Status: AC
  Administered 2021-06-12: 400 mg via ORAL
  Filled 2021-06-12: qty 2

## 2021-06-12 MED ORDER — LACTATED RINGERS IV BOLUS
250.0000 mL | Freq: Once | INTRAVENOUS | Status: AC
  Administered 2021-06-12: 250 mL via INTRAVENOUS

## 2021-06-12 MED ORDER — MIDAZOLAM HCL 2 MG/2ML IJ SOLN: 0.5000 mg | Freq: Once | INTRAMUSCULAR | Status: DC | PRN

## 2021-06-12 MED ORDER — MIDAZOLAM HCL 2 MG/2ML IJ SOLN
INTRAMUSCULAR | Status: AC
Start: 2021-06-12 — End: ?
  Filled 2021-06-12: qty 2

## 2021-06-12 MED ORDER — SODIUM CHLORIDE 0.9 % IR SOLN
Status: DC | PRN
  Administered 2021-06-12: 3000 mL

## 2021-06-12 MED ORDER — SODIUM CHLORIDE FLUSH 0.9 % IV SOLN
INTRAVENOUS | Status: DC | PRN
  Administered 2021-06-12: 60 mL via INTRAVENOUS

## 2021-06-12 MED ORDER — ORAL CARE MOUTH RINSE: 15.0000 mL | Freq: Once | OROMUCOSAL | Status: AC

## 2021-06-12 MED ORDER — ONDANSETRON HCL 4 MG/2ML IJ SOLN
INTRAMUSCULAR | Status: AC
  Filled 2021-06-12: qty 2

## 2021-06-12 MED ORDER — OXYCODONE HCL 5 MG PO TABS: 5.0000 mg | ORAL_TABLET | ORAL | 0 refills | Status: AC | PRN

## 2021-06-12 MED ORDER — FENTANYL CITRATE (PF) 100 MCG/2ML IJ SOLN
INTRAMUSCULAR | Status: DC | PRN
Start: 2021-06-12 — End: 2021-06-12
  Administered 2021-06-12 (×2): 50 ug via INTRAVENOUS

## 2021-06-12 MED ORDER — ONDANSETRON HCL 4 MG PO TABS
4.0000 mg | ORAL_TABLET | Freq: Four times a day (QID) | ORAL | Status: DC | PRN
  Administered 2021-06-12: 4 mg via ORAL
  Filled 2021-06-12 (×3): qty 1

## 2021-06-12 MED ORDER — METHOCARBAMOL 500 MG PO TABS: 500.0000 mg | ORAL_TABLET | Freq: Three times a day (TID) | ORAL | 0 refills | Status: AC | PRN

## 2021-06-12 MED ORDER — SODIUM CHLORIDE (PF) 0.9 % IJ SOLN
INTRAMUSCULAR | Status: AC
  Filled 2021-06-12: qty 10

## 2021-06-12 MED ORDER — CELECOXIB 100 MG PO CAPS: 100.0000 mg | ORAL_CAPSULE | Freq: Two times a day (BID) | ORAL | 0 refills | Status: AC

## 2021-06-12 MED ORDER — CEFAZOLIN SODIUM-DEXTROSE 2-4 GM/100ML-% IV SOLN
2.0000 g | INTRAVENOUS | Status: AC
  Administered 2021-06-12: 2 g via INTRAVENOUS
  Filled 2021-06-12: qty 100

## 2021-06-12 MED ORDER — OXYCODONE HCL 5 MG PO TABS: 5.0000 mg | ORAL_TABLET | Freq: Once | ORAL | Status: DC | PRN

## 2021-06-12 MED ORDER — LACTATED RINGERS IV BOLUS
500.0000 mL | Freq: Once | INTRAVENOUS | Status: AC
  Administered 2021-06-12: 500 mL via INTRAVENOUS

## 2021-06-12 MED ORDER — DEXAMETHASONE SODIUM PHOSPHATE 10 MG/ML IJ SOLN
INTRAMUSCULAR | Status: AC
  Filled 2021-06-12: qty 1

## 2021-06-12 MED ORDER — TRANEXAMIC ACID-NACL 1000-0.7 MG/100ML-% IV SOLN
1000.0000 mg | INTRAVENOUS | Status: AC
  Administered 2021-06-12: 1000 mg via INTRAVENOUS
  Filled 2021-06-12: qty 100

## 2021-06-12 MED ORDER — MEPERIDINE HCL 50 MG/ML IJ SOLN: 6.2500 mg | INTRAMUSCULAR | Status: DC | PRN

## 2021-06-12 MED ORDER — POVIDONE-IODINE 10 % EX SWAB
2.0000 "application " | Freq: Once | CUTANEOUS | Status: AC
  Administered 2021-06-12: 2 via TOPICAL

## 2021-06-12 MED ORDER — BUPIVACAINE LIPOSOME 1.3 % IJ SUSP
INTRAMUSCULAR | Status: AC
  Filled 2021-06-12: qty 20

## 2021-06-12 MED ORDER — CHLORHEXIDINE GLUCONATE 0.12 % MT SOLN
15.0000 mL | Freq: Once | OROMUCOSAL | Status: AC
  Administered 2021-06-12: 15 mL via OROMUCOSAL

## 2021-06-12 MED ORDER — ACETAMINOPHEN 500 MG PO TABS: 1000.0000 mg | ORAL_TABLET | Freq: Three times a day (TID) | ORAL | 0 refills | Status: AC | PRN

## 2021-06-12 MED ORDER — BUPIVACAINE IN DEXTROSE 0.75-8.25 % IT SOLN
INTRATHECAL | Status: DC | PRN
  Administered 2021-06-12: 15 mg via INTRATHECAL

## 2021-06-12 MED ORDER — METHOCARBAMOL 500 MG IVPB - SIMPLE MED: 500.0000 mg | Freq: Four times a day (QID) | INTRAVENOUS | Status: DC | PRN

## 2021-06-12 MED ORDER — ONDANSETRON HCL 4 MG/2ML IJ SOLN: 4.0000 mg | Freq: Four times a day (QID) | INTRAMUSCULAR | Status: DC | PRN

## 2021-06-12 MED ORDER — OXYCODONE HCL 5 MG/5ML PO SOLN: 5.0000 mg | Freq: Once | ORAL | Status: DC | PRN

## 2021-06-12 MED ORDER — METHOCARBAMOL 500 MG PO TABS: 500.0000 mg | ORAL_TABLET | Freq: Four times a day (QID) | ORAL | Status: DC | PRN

## 2021-06-12 MED ORDER — BUPIVACAINE LIPOSOME 1.3 % IJ SUSP
INTRAMUSCULAR | Status: DC | PRN
  Administered 2021-06-12: 20 mL

## 2021-06-12 MED ORDER — LACTATED RINGERS IV SOLN
INTRAVENOUS | Status: DC
Start: 2021-06-12 — End: 2021-06-12

## 2021-06-12 MED ORDER — ONDANSETRON 4 MG PO TBDP
ORAL_TABLET | ORAL | Status: AC
  Filled 2021-06-12: qty 1

## 2021-06-12 MED ORDER — ACETAMINOPHEN 500 MG PO TABS: 1000.0000 mg | ORAL_TABLET | Freq: Once | ORAL | Status: DC

## 2021-06-12 MED ORDER — ROPIVACAINE HCL 7.5 MG/ML IJ SOLN
INTRAMUSCULAR | Status: DC | PRN
  Administered 2021-06-12: 20 mL via PERINEURAL

## 2021-06-12 MED ORDER — ONDANSETRON HCL 4 MG PO TABS: 4.0000 mg | ORAL_TABLET | Freq: Three times a day (TID) | ORAL | 0 refills | Status: AC | PRN

## 2021-06-12 SURGICAL SUPPLY — 65 items
BAG COUNTER SPONGE SURGICOUNT (BAG) ×2 IMPLANT
BLADE SAG 13.0X1.37X90 (BLADE) ×2 IMPLANT
BLADE SAW RECIPROCATING 77.5 (BLADE) ×2 IMPLANT
BLADE SAW SGTL 13.0X1.19X90.0M (BLADE) ×1 IMPLANT
BLADE SURG 15 STRL LF DISP TIS (BLADE) ×1 IMPLANT
BLADE SURG 15 STRL SS (BLADE) ×1
BNDG ELASTIC 6X10 VLCR STRL LF (GAUZE/BANDAGES/DRESSINGS) ×2 IMPLANT
BOWL SMART MIX CTS (DISPOSABLE) ×2 IMPLANT
CEMENT BONE R 1X40 (Cement) ×1 IMPLANT
CHLORAPREP W/TINT 26 (MISCELLANEOUS) ×2 IMPLANT
CLSR STERI-STRIP ANTIMIC 1/2X4 (GAUZE/BANDAGES/DRESSINGS) ×1 IMPLANT
COMP FEM CMT PS 7 RT (Joint) ×2 IMPLANT
COMP TIB CMT PS PK RM J (Joint) ×2 IMPLANT
COMPONENT FEM CMT PS 7 RT (Joint) IMPLANT
COMPONENT TIB CMT PS PK RM J (Joint) IMPLANT
COVER SURGICAL LIGHT HANDLE (MISCELLANEOUS) ×2 IMPLANT
CUFF TOURN SGL QUICK 34 (TOURNIQUET CUFF) ×1
CUFF TRNQT CYL 34X4.125X (TOURNIQUET CUFF) ×1 IMPLANT
DERMABOND ADVANCED (GAUZE/BANDAGES/DRESSINGS) ×1
DERMABOND ADVANCED .7 DNX12 (GAUZE/BANDAGES/DRESSINGS) ×1 IMPLANT
DRAPE 3/4 80X56 (DRAPES) ×2 IMPLANT
DRAPE HIP W/POCKET STRL (MISCELLANEOUS) ×2 IMPLANT
DRAPE INCISE IOBAN 66X45 STRL (DRAPES) ×2 IMPLANT
DRAPE U-SHAPE 47X51 STRL (DRAPES) ×2 IMPLANT
DRSG AQUACEL AG ADV 3.5X 6 (GAUZE/BANDAGES/DRESSINGS) ×2 IMPLANT
ELECT REM PT RETURN 15FT ADLT (MISCELLANEOUS) ×2 IMPLANT
GLOVE BIO SURGEON STRL SZ7.5 (GLOVE) ×4 IMPLANT
GLOVE BIO SURGEON STRL SZ8 (GLOVE) ×4 IMPLANT
GLOVE BIOGEL PI IND STRL 7.5 (GLOVE) ×1 IMPLANT
GLOVE BIOGEL PI IND STRL 8 (GLOVE) ×1 IMPLANT
GLOVE BIOGEL PI INDICATOR 7.5 (GLOVE) ×1
GLOVE BIOGEL PI INDICATOR 8 (GLOVE) ×1
GOWN STRL REUS W/ TWL LRG LVL3 (GOWN DISPOSABLE) IMPLANT
GOWN STRL REUS W/ TWL XL LVL3 (GOWN DISPOSABLE) ×1 IMPLANT
GOWN STRL REUS W/TWL LRG LVL3 (GOWN DISPOSABLE)
GOWN STRL REUS W/TWL XL LVL3 (GOWN DISPOSABLE) ×1
HANDPIECE INTERPULSE COAX TIP (DISPOSABLE) ×1
HDLS TROCR DRIL PIN KNEE 75 (PIN) ×1
INSERT TIB BEAR PS J 8 RT (Insert) ×1 IMPLANT
INSERTER TIP PARTIAL KNEE (MISCELLANEOUS) ×1 IMPLANT
KIT TURNOVER KIT A (KITS) ×1 IMPLANT
MANIFOLD NEPTUNE II (INSTRUMENTS) ×2 IMPLANT
NS IRRIG 1000ML POUR BTL (IV SOLUTION) ×2 IMPLANT
PACK TOTAL KNEE CUSTOM (KITS) ×2 IMPLANT
PIN DRILL HDLS TROCAR 75 4PK (PIN) IMPLANT
PROTECTOR NERVE ULNAR (MISCELLANEOUS) ×2 IMPLANT
Partial Articular surface (Orthopedic Implant) ×1 IMPLANT
Partial Femur Cemented (Orthopedic Implant) ×1 IMPLANT
Partial Tibial Cemented (Orthopedic Implant) ×1 IMPLANT
SCREW HEADED 33MM KNEE (MISCELLANEOUS) ×3 IMPLANT
SET HNDPC FAN SPRY TIP SCT (DISPOSABLE) ×1 IMPLANT
SUCTION FRAZIER HANDLE 10FR (MISCELLANEOUS) ×1
SUCTION TUBE FRAZIER 10FR DISP (MISCELLANEOUS) ×1 IMPLANT
SUT MNCRL AB 3-0 PS2 27 (SUTURE) ×2 IMPLANT
SUT STRATAFIX 0 PDS 27 VIOLET (SUTURE) ×2
SUT STRATAFIX PDO 1 14 VIOLET (SUTURE) ×1
SUT STRATFX PDO 1 14 VIOLET (SUTURE) ×1
SUT VIC AB 0 CT1 36 (SUTURE) ×2 IMPLANT
SUT VIC AB 1 CT1 36 (SUTURE) ×2 IMPLANT
SUT VIC AB 2-0 CT1 27 (SUTURE) ×2
SUT VIC AB 2-0 CT1 TAPERPNT 27 (SUTURE) ×2 IMPLANT
SUTURE STRATFX 0 PDS 27 VIOLET (SUTURE) ×1 IMPLANT
SUTURE STRATFX PDO 1 14 VIOLET (SUTURE) ×1 IMPLANT
TRAY FOLEY MTR SLVR 16FR STAT (SET/KITS/TRAYS/PACK) ×2 IMPLANT
WRAP KNEE MAXI GEL POST OP (GAUZE/BANDAGES/DRESSINGS) ×2 IMPLANT

## 2021-06-12 NOTE — Evaluation (Signed)
Physical Therapy Evaluation ?Patient Details ?Name: Corey Bruce ?MRN: [id number] ?DOB: 07-Jul-1950 ?Today's Date: 06/12/2021 ? ?History of Present Illness ? Pt is 71 yo male admitted 06/12/21 with R knee OA and s/p R medial unilateral knee arthroplasty.  Pt with hx of L partial knee replacement, HTN, DJD, OSA, OA, CAD, back sx.  ?Clinical Impression ? Pt is s/p R partial knee replacement resulting in the deficits listed below (see PT Problem List). At baseline pt is independent.  PT asked to see pt in PACU for possible same day d/c.  Upon sitting, pt reports significant numbness in gluteal and groin region - likely still from spinal.  He was able to stand with assist but unable to step safely.  Pt had excellent pain control, quad activation (SLR no lag), and ROM 0 to 90 degrees.  Pt expected to progress well as spinal wears off.  Will plan to see pt again this afternoon -expect will be able to d/c. Pt will benefit from skilled PT to increase their independence and safety with mobility to allow discharge to the venue listed below.  ?   ?   ? ?Recommendations for follow up therapy are one component of a multi-disciplinary discharge planning process, led by the attending physician.  Recommendations may be updated based on patient status, additional functional criteria and insurance authorization. ? ?Follow Up Recommendations Follow physician's recommendations for discharge plan and follow up therapies ? ?  ?Assistance Recommended at Discharge Frequent or constant Supervision/Assistance  ?Patient can return home with the following ? A little help with walking and/or transfers;A little help with bathing/dressing/bathroom;Assistance with cooking/housework;Help with stairs or ramp for entrance ? ?  ?Equipment Recommendations None recommended by PT  ?Recommendations for Other Services ?    ?  ?Functional Status Assessment Patient has had a recent decline in their functional status and demonstrates the ability to make  significant improvements in function in a reasonable and predictable amount of time.  ? ?  ?Precautions / Restrictions Precautions ?Precautions: Fall ?Restrictions ?Weight Bearing Restrictions: Yes ?RLE Weight Bearing: Weight bearing as tolerated  ? ?  ? ?Mobility ? Bed Mobility ?Overal bed mobility: Needs Assistance ?Bed Mobility: Supine to Sit, Sit to Supine ?  ?  ?Supine to sit: Min assist ?Sit to supine: Min assist ?  ?General bed mobility comments: Light min A for R LE ?  ? ?Transfers ?Overall transfer level: Needs assistance ?Equipment used: Rolling walker (2 wheels) ?Transfers: Sit to/from Stand ?Sit to Stand: Min assist, +2 safety/equipment ?  ?  ?  ?  ?  ?General transfer comment: Min A to stand to change sheets, Nurse tech present to assist with sheets and for safety; cues for hand placement ?  ? ?Ambulation/Gait ?  ?  ?  ?  ?  ?  ?  ?General Gait Details: unable to take steps due to weak gluteal region (Spinal) ? ?Stairs ?  ?  ?  ?  ?  ? ?Wheelchair Mobility ?  ? ?Modified Rankin (Stroke Patients Only) ?  ? ?  ? ?Balance Overall balance assessment: Needs assistance ?Sitting-balance support: No upper extremity supported ?Sitting balance-Leahy Scale: Good ?  ?  ?Standing balance support: Bilateral upper extremity supported, Reliant on assistive device for balance ?Standing balance-Leahy Scale: Poor ?Standing balance comment: requiring RW ?  ?  ?  ?  ?  ?  ?  ?  ?  ?  ?  ?   ? ? ? ?Pertinent Vitals/Pain Pain Assessment ?  Pain Assessment: No/denies pain  ? ? ?Home Living Family/patient expects to be discharged to:: Private residence ?Living Arrangements: Spouse/significant other ?Available Help at Discharge: Family;Available 24 hours/day ?Type of Home: House ?Home Access: Stairs to enter ?Entrance Stairs-Rails: Left;Right ?Entrance Stairs-Number of Steps: 4 ?  ?Home Layout: One level ?Home Equipment: Advice worker (2 wheels) ?   ?  ?Prior Function Prior Level of Function : Independent/Modified  Independent ?  ?  ?  ?  ?  ?  ?Mobility Comments: could ambulate in community ?  ?  ? ? ?Hand Dominance  ?   ? ?  ?Extremity/Trunk Assessment  ? Upper Extremity Assessment ?Upper Extremity Assessment: Overall WFL for tasks assessed ?  ? ?Lower Extremity Assessment ?Lower Extremity Assessment: LLE deficits/detail;RLE deficits/detail ?RLE Deficits / Details: ROM: knee 0 to 90 , all other WFL; MMT: ankle 5/5, knee 3/5, hip flex 3/5; SLR with no ext lag ?RLE Sensation: decreased light touch (pt reports some numbness in buttock still but upon sitting reports completely numb buttock/groin and pt incontinent) ?LLE Deficits / Details: ROM WFL; MMT ankle 5/5, knee ext 5/5, hip flex 5/5 ?  ? ?Cervical / Trunk Assessment ?Cervical / Trunk Assessment: Normal  ?Communication  ? Communication: No difficulties  ?Cognition Arousal/Alertness: Awake/alert ?Behavior During Therapy: [facility] for tasks assessed/performed ?Overall Cognitive Status: Within Functional Limits for tasks assessed ?  ?  ?  ?  ?  ?  ?  ?  ?  ?  ?  ?  ?  ?  ?  ?  ?  ?  ?  ? ?  ?General Comments General comments (skin integrity, edema, etc.): Educated on HEP ? ?  ?Exercises Total Joint Exercises ?Ankle Circles/Pumps: AROM, Both, 5 reps, Supine ?Quad Sets: AROM, Both, 5 reps, Supine ?Heel Slides: AROM, Right, 5 reps, Supine ?Hip ABduction/ADduction: AROM, Right, 5 reps, Supine ?Long Arc Quad: AROM, Right, 5 reps, Seated ?Knee Flexion: AROM, 5 reps, Right, Seated ?Goniometric ROM: R kn ee 0 to 90  ? ?Assessment/Plan  ?  ?PT Assessment Patient needs continued PT services  ?PT Problem List Decreased strength;Decreased mobility;Decreased range of motion;Decreased coordination;Decreased activity tolerance;Cardiopulmonary status limiting activity;Decreased balance;Decreased knowledge of use of DME ? ?   ?  ?PT Treatment Interventions DME instruction;Therapeutic activities;Modalities;Therapeutic exercise;Patient/family education;Gait training;Stair training;Balance  training;Functional mobility training   ? ?PT Goals (Current goals can be found in the Care Plan section)  ?Acute Rehab PT Goals ?Patient Stated Goal: return home ?PT Goal Formulation: With patient ?Time For Goal Achievement: 06/26/21 ?Potential to Achieve Goals: Good ? ?  ?Frequency 7X/week ?  ? ? ?Co-evaluation   ?  ?  ?  ?  ? ? ?  ?AM-PAC PT "6 Clicks" Mobility  ?Outcome Measure Help needed turning from your back to your side while in a flat bed without using bedrails?: A Little ?Help needed moving from lying on your back to sitting on the side of a flat bed without using bedrails?: A Little ?Help needed moving to and from a bed to a chair (including a wheelchair)?: A Little ?Help needed standing up from a chair using your arms (e.g., wheelchair or bedside chair)?: A Little ?Help needed to walk in hospital room?: Total ?Help needed climbing 3-5 steps with a railing? : Total ?6 Click Score: 14 ? ?  ?End of Session Equipment Utilized During Treatment: Gait belt ?Activity Tolerance: Patient tolerated treatment well ?Patient left: in bed;with call bell/phone within reach ?Nurse Communication: Mobility status ?PT Visit  Diagnosis: Other abnormalities of gait and mobility (R26.89);Muscle weakness (generalized) (M62.81) ?  ? ?Time: 3151-7616 ?PT Time Calculation (min) (ACUTE ONLY): 26 min ? ? ?Charges:   PT Evaluation ?$PT Eval Low Complexity: 1 Low ?PT Treatments ?$Therapeutic Exercise: 8-22 mins ?  ?   ? ? ?Abran Richard, PT ?Acute Rehab Services ?Pager [phone] ?[facility] [phone] ? ? ?Mikael Spray Oryn Casanova ?06/12/2021, 2:00 PM ?

## 2021-06-12 NOTE — Progress Notes (Signed)
Orthopedic Tech Progress Note ?Patient Details:  ?Corey Bruce [birth year] ?[id number] ? ?Patient ID: Corey Bruce, male   DOB: [birth year], 71 y.o.   MRN: [id number] ? ?[name] ?06/12/2021, 11:11 AM ?Bone foam applied in pacu ?

## 2021-06-12 NOTE — Anesthesia Postprocedure Evaluation (Signed)
Anesthesia Post Note ? ?Patient: Corey Bruce ? ?Procedure(s) Performed: UNICOMPARTMENTAL KNEE (Right: Knee) ? ?  ? ?Patient location during evaluation: Phase II ?Anesthesia Type: Spinal ?Level of consciousness: awake and alert, patient cooperative and oriented ?Vital Signs Assessment: post-procedure vital signs reviewed and stable ?Respiratory status: nonlabored ventilation, spontaneous breathing and respiratory function stable ?Cardiovascular status: stable and blood pressure returned to baseline ?Postop Assessment: no apparent nausea or vomiting, able to ambulate and adequate PO intake ?Anesthetic complications: no ? ? ?No notable events documented. ? ?Last Vitals:  ?Vitals:  ? 06/12/21 1232 06/12/21 1257  ?BP: (!) 175/100 (!) 169/95  ?Pulse: (!) 49 (!) 49  ?Resp: 18 16  ?Temp:    ?SpO2: 100% 98%  ?  ?Last Pain:  ?Vitals:  ? 06/12/21 1257  ?TempSrc:   ?PainSc: 0-No pain  ? ? ?  ?  ?  ?  ?  ?  ? ?Lashayla Armes,E. Islam Villescas ? ? ? ? ?

## 2021-06-12 NOTE — Transfer of Care (Signed)
Immediate Anesthesia Transfer of Care Note ? ?Patient: Corey Bruce ? ?Procedure(s) Performed: UNICOMPARTMENTAL KNEE (Right: Knee) ? ?Patient Location: PACU ? ?Anesthesia Type:Regional and Spinal ? ?Level of Consciousness: awake, alert  and oriented ? ?Airway & Oxygen Therapy: Patient Spontanous Breathing and Patient connected to face mask oxygen ? ?Post-op Assessment: Report given to RN and Post -op Vital signs reviewed and stable ? ?Post vital signs: Reviewed and stable ? ?Last Vitals:  ?Vitals Value Taken Time  ?BP 128/84 06/12/21 1056  ?Temp    ?Pulse 55 06/12/21 1058  ?Resp 18 06/12/21 1058  ?SpO2 99 % 06/12/21 1058  ?Vitals shown include unvalidated device data. ? ?Last Pain:  ?Vitals:  ? 06/12/21 0707  ?TempSrc: Oral  ?PainSc:   ?   ? ?Patients Stated Pain Goal: 3 (06/12/21 3557) ? ?Complications: No notable events documented. ?

## 2021-06-12 NOTE — Interval H&P Note (Signed)
The patient has been re-examined, and the chart reviewed, and there have been no interval changes to the documented history and physical.   ? ?Plan for right medial unilateral knee arthroplasty, with Intra-Op assessment and total knee arthroplasty available if needed. ? ?The operative side was examined and the patient was confirmed to have. Sens DPN, SPN, TN intact, Motor EHL, ext, flex 5/5, and DP 2+, PT 2+, No significant edema. ? ? ?The risks, benefits, and alternatives have been discussed at length with patient, and the patient is willing to proceed.  Right knee marked. Consent has been signed. ? ?

## 2021-06-12 NOTE — Anesthesia Procedure Notes (Signed)
Spinal ? ?Patient location during procedure: OR ?End time: 06/12/2021 8:37 AM ?Reason for block: surgical anesthesia ?Staffing ?Performed: resident/CRNA  ?Resident/CRNA: Marijo Conception, CRNA ?Preanesthetic Checklist ?Completed: patient identified, IV checked, site marked, risks and benefits discussed, surgical consent, monitors and equipment checked, pre-op evaluation and timeout performed ?Spinal Block ?Patient position: sitting ?Prep: DuraPrep ?Patient monitoring: heart rate, continuous pulse ox and blood pressure ?Approach: midline ?Location: L3-4 ?Injection technique: single-shot ?Needle ?Needle type: Spinocan  ?Needle gauge: 24 G ?Needle length: 9 cm ?Assessment ?Sensory level: T6 ?Events: CSF return ? ? ? ?

## 2021-06-12 NOTE — Op Note (Signed)
06/12/2021 ? ?10:33 AM ? ?PATIENT:  Corey Bruce   ? ?PRE-OPERATIVE DIAGNOSIS: End-stage isolated medial compartment osteoarthritis ? ?POST-OPERATIVE DIAGNOSIS:  Same ? ?PROCEDURE:  Medial unicompartmental Knee Arthroplasty ? ?SURGEON:  Willaim Sheng, MD ? ?PHYSICIAN ASSISTANT: Charlies Constable, MD, present and scrubbed throughout the case, critical for completion in a timely fashion, and for retraction, instrumentation, and closure. ? ?ANESTHESIA:   Spinal ? ?ESTIMATED BLOOD LOSS: 25cc ? ?PREOPERATIVE INDICATIONS:  Corey Bruce is a  71 y.o. male with a diagnosis of OA RIGHT KNEE who failed conservative measures and elected for surgical management.   ? ?The risks benefits and alternatives were discussed with the patient preoperatively including but not limited to the risks of infection, bleeding, nerve injury, cardiopulmonary complications, blood clots, the need for revision surgery, among others, and the patient was willing to proceed. ? ?OPERATIVE IMPLANTS: [employer] PPK fixed bearing medial compartment arthroplasty femur size 7, tibia size J, bearing size 23m. ? ?OPERATIVE FINDINGS: Endstage grade 4 anteromedial compartment osteoarthritis. No significant changes in the lateral or patellofemoral joint.  The ACL was intact. ? ?OPERATIVE PROCEDURE:  ? Once adequate anesthesia, preoperative antibiotics, 2 gm of ancef,1 gm of Tranexamic Acid, and 8 mg of Decadron administered, the patient was positioned supine with a right thigh tourniquet placed.  The right lower extremity was prepped and draped in sterile fashion.  A time-  out was performed identifying the patient, planned procedure, and the appropriate extremity.  ? ?The leg was elevated and exsanguinated and the tourniquet was inflated. Anterior midline incision was performed. Medial Parapatellar incision was carried out, and the osteophytes were excised, along with the medial plateau and femoral condyle. Resected anterior horn of medial  meniscus and a small portion of the fat pad. Medial release was performed. ? ?Remainder of the knee was evaluated.  ACL was intact lateral compartment with no significant wear.  Patellofemoral compartment with mild wear. ? ?The extra medullary tibial cutting jig was applied, and resection was performed measuring 4 mm from the anterior medial defect.  Cut was made perpendicular to the axis of the tibia, matching the patient's native slope, and sagittal cut was made with appropriate rotation and just medial to the ACL insertion.   ? ?The proximal tibial bony cut was removed in one piece, and I turned my attention to the femur.  Rasp was used to clear debris from the corner of the cut. ? ?We next turned our attention to the femur.  Distal femoral cutting block was put in place and pinned and the distal femur was cut.  The cutting guide was removed and the cut was completed with the knee in flexion. ? ?We then sized the femur to be a 7 and then pinned the femur cutting guide into place taking care to appropriately lateralized and not overhang the component, medially or anteriorly. ? ?The femur was drilled and the posterior and chamfer cuts were made. ? ?With the knee in flexion medial meniscectomy was performed. ? ?We next size of the tibia and found it to be a size J.  The tibial trial was then impacted into place in the right position pinned and the lugs were drilled. ? ?The trial femoral component was then impacted, and a size 868mliner trial was inserted.  ?With the trial liner in place we used the Amber sticks and had approximately 2 mm of laxity in extension and 3 mm of laxity in flexion.  The femur also tracked appropriately on  the center of the tibial component through flexion. ? ? ?I then cemented the components into place, cementing the tibia first, removing all excess cement, and then cementing the femur.  All loose cement was removed. ? ?The trial liner was again inserted until cement had completely set.   The wounds were thoroughly irrigated with normal saline pulse lavage and injected with dilute Exparel. ? ?Once the cement was set again we assessed stability and balance which we felt was appropriate with a size 8 mm liner.  The real polyethylene liner was opened and inserted. ? ? The tourniquet was let down  No significant  ? hemostasis was required.  The medial parapatellar arthrotomy was then reapproximated using #1 Stratafix sutures with the knee  in flexion.  The  ? remaining wound was closed with 0 stratafix, 2-0 Vicryl, and running 3-0 Monocryl.  ? The knee was cleaned, dried, dressed sterilely using Dermabond and  ? Aquacel dressing.  The patient was then  brought to recovery room in stable condition, tolerating the procedure  well. There were no complications. ? ? ?Post op recs: ?WB: WBAT ?Abx: ancef x23 hours post op ?Imaging: PACU xrays ?DVT prophylaxis: Aspirin '81mg'$  BID x4 weeks ?Follow up: 2 weeks after surgery for a wound check with Dr. Zachery Dakins at [facility].  ?Address: [address] [place], [place], [place] [address]  ?Office Phone: [phone] ? ?Charlies Constable, MD ?Orthopaedic Surgery   ?

## 2021-06-12 NOTE — Discharge Instructions (Signed)
INSTRUCTIONS AFTER JOINT REPLACEMENT  ? ?Remove items at home which could result in a fall. This includes throw rugs or furniture in walking pathways ?ICE to the affected joint every three hours while awake for 30 minutes at a time, for at least the first 3-5 days, and then as needed for pain and swelling.  Continue to use ice for pain and swelling. You may notice swelling that will progress down to the foot and ankle.  This is normal after surgery.  Elevate your leg when you are not up walking on it.   ?Continue to use the breathing machine you got in the hospital (incentive spirometer) which will help keep your temperature down.  It is common for your temperature to cycle up and down following surgery, especially at night when you are not up moving around and exerting yourself.  The breathing machine keeps your lungs expanded and your temperature down. ? ? ?DIET:  As you were doing prior to hospitalization, we recommend a well-balanced diet. ? ?DRESSING / WOUND CARE / SHOWERING ? ?Keep the surgical dressing until follow up.  The dressing is water proof, so you can shower without any extra covering.  IF THE DRESSING FALLS OFF or the wound gets wet inside, change the dressing with sterile gauze.  Please use good hand washing techniques before changing the dressing.  Do not use any lotions or creams on the incision until instructed by your surgeon.   ? ?ACTIVITY ? ?Increase activity slowly as tolerated, but follow the weight bearing instructions below.   ?No driving for 6 weeks or until further direction given by your physician.  You cannot drive while taking narcotics.  ?No lifting or carrying greater than 10 lbs. until further directed by your surgeon. ?Avoid periods of inactivity such as sitting longer than an hour when not asleep. This helps prevent blood clots.  ?You may return to work once you are authorized by your doctor.  ? ? ? ?WEIGHT BEARING  ? ?Weight bearing as tolerated with assist device (walker, cane,  etc) as directed, use it as long as suggested by your surgeon or therapist, typically at least 4-6 weeks. ? ? ?EXERCISES ? ?Results after joint replacement surgery are often greatly improved when you follow the exercise, range of motion and muscle strengthening exercises prescribed by your doctor. Safety measures are also important to protect the joint from further injury. Any time any of these exercises cause you to have increased pain or swelling, decrease what you are doing until you are comfortable again and then slowly increase them. If you have problems or questions, call your caregiver or physical therapist for advice.  ? ?Rehabilitation is important following a joint replacement. After just a few days of immobilization, the muscles of the leg can become weakened and shrink (atrophy).  These exercises are designed to build up the tone and strength of the thigh and leg muscles and to improve motion. Often times heat used for twenty to thirty minutes before working out will loosen up your tissues and help with improving the range of motion but do not use heat for the first two weeks following surgery (sometimes heat can increase post-operative swelling).  ? ?These exercises can be done on a training (exercise) mat, on the floor, on a table or on a bed. Use whatever works the best and is most comfortable for you.    Use music or television while you are exercising so that the exercises are a pleasant break in your   day. This will make your life better with the exercises acting as a break in your routine that you can look forward to.   Perform all exercises about fifteen times, three times per day or as directed.  You should exercise both the operative leg and the other leg as well. ? ?Exercises include: ?  ?Quad Sets - Tighten up the muscle on the front of the thigh (Quad) and hold for 5-10 seconds.   ?Straight Leg Raises - With your knee straight (if you were given a brace, keep it on), lift the leg to 60  degrees, hold for 3 seconds, and slowly lower the leg.  Perform this exercise against resistance later as your leg gets stronger.  ?Leg Slides: Lying on your back, slowly slide your foot toward your buttocks, bending your knee up off the floor (only go as far as is comfortable). Then slowly slide your foot back down until your leg is flat on the floor again.  ?Angel Wings: Lying on your back spread your legs to the side as far apart as you can without causing discomfort.  ?Hamstring Strength:  Lying on your back, push your heel against the floor with your leg straight by tightening up the muscles of your buttocks.  Repeat, but this time bend your knee to a comfortable angle, and push your heel against the floor.  You may put a pillow under the heel to make it more comfortable if necessary.  ? ?A rehabilitation program following joint replacement surgery can speed recovery and prevent re-injury in the future due to weakened muscles. Contact your doctor or a physical therapist for more information on knee rehabilitation.  ? ? ?CONSTIPATION ? ?Constipation is defined medically as fewer than three stools per week and severe constipation as less than one stool per week.  Even if you have a regular bowel pattern at home, your normal regimen is likely to be disrupted due to multiple reasons following surgery.  Combination of anesthesia, postoperative narcotics, change in appetite and fluid intake all can affect your bowels.  ? ?YOU MUST use at least one of the following options; they are listed in order of increasing strength to get the job done.  They are all available over the counter, and you may need to use some, POSSIBLY even all of these options:   ? ?Drink plenty of fluids (prune juice may be helpful) and high fiber foods ?Colace 100 mg by mouth twice a day  ?Senokot for constipation as directed and as needed Dulcolax (bisacodyl), take with full glass of water  ?Miralax (polyethylene glycol) once or twice a day as  needed. ? ?If you have tried all these things and are unable to have a bowel movement in the first 3-4 days after surgery call either your surgeon or your primary doctor.   ? ?If you experience loose stools or diarrhea, hold the medications until you stool forms back up.  If your symptoms do not get better within 1 week or if they get worse, check with your doctor.  If you experience "the worst abdominal pain ever" or develop nausea or vomiting, please contact the office immediately for further recommendations for treatment. ? ? ?ITCHING:  If you experience itching with your medications, try taking only a single pain pill, or even half a pain pill at a time.  You can also use Benadryl over the counter for itching or also to help with sleep.  ? ?TED HOSE STOCKINGS:  Use stockings on both   legs until for at least 2 weeks or as directed by physician office. They may be removed at night for sleeping. ? ?MEDICATIONS:  See your medication summary on the ?After Visit Summary? that nursing will review with you.  You may have some home medications which will be placed on hold until you complete the course of blood thinner medication.  It is important for you to complete the blood thinner medication as prescribed. ? ? ?Blood clot prevention (DVT Prophylaxis): After surgery you are at an increased risk for a blood clot. you were prescribed a blood thinner, Aspirin '81mg'$ , to be taken twice daily for a total of 4 weeks from surgery to help reduce your risk of getting a blood clot. This will help prevent a blood clot. Signs of a pulmonary embolus (blood clot in the lungs) include sudden short of breath, feeling lightheaded or dizzy, chest pain with a deep breath, rapid pulse rapid breathing. Signs of a blood clot in your arms or legs include new unexplained swelling and cramping, warm, red or darkened skin around the painful area. Please call the office or 911 right away if these signs or symptoms develop. ? ?PRECAUTIONS:  If you  experience chest pain or shortness of breath - call 911 immediately for transfer to the hospital emergency department.  ? ?If you develop a fever greater that 101 F, purulent drainage from wound, increased r

## 2021-06-12 NOTE — Progress Notes (Signed)
Physical Therapy Evaluation ?Patient Details ?Name: Corey Bruce ?MRN: [id number] ?DOB: 08/30/1950 ?Today's Date: 06/12/2021 ? ?History of Present Illness ? Pt is 70 yo male admitted 06/12/21 with R knee OA and s/p R medial unilateral knee arthroplasty.  Pt with hx of L partial knee replacement, HTN, DJD, OSA, OA, CAD, back sx.  ?Clinical Impression ?  ?Checked on pt several time in afternoon - sensation finally improved around 1640 and pt able to work with PT.  He was able to activate glutes and ambulate 100'.  Pt also performed stairs multiple methods to simulate home environment.  Pt demonstrates safe gait & transfers in order to return home from PT perspective once discharged by MD.  While in hospital, will continue to benefit from PT for skilled therapy to advance mobility and exercises.   ?   ?   ? ?Recommendations for follow up therapy are one component of a multi-disciplinary discharge planning process, led by the attending physician.  Recommendations may be updated based on patient status, additional functional criteria and insurance authorization. ? ?Follow Up Recommendations Follow physician's recommendations for discharge plan and follow up therapies ? ?  ?Assistance Recommended at Discharge Frequent or constant Supervision/Assistance  ?Patient can return home with the following ? A little help with walking and/or transfers;A little help with bathing/dressing/bathroom;Assistance with cooking/housework;Help with stairs or ramp for entrance ? ?  ?Equipment Recommendations None recommended by PT  ?Recommendations for Other Services ?    ?  ?Functional Status Assessment Patient has had a recent decline in their functional status and demonstrates the ability to make significant improvements in function in a reasonable and predictable amount of time.  ? ?  ?Precautions / Restrictions Precautions ?Precautions: Fall ?Restrictions ?Weight Bearing Restrictions: Yes ?RLE Weight Bearing: Weight bearing as tolerated   ? ?  ? ?Mobility ? Bed Mobility ?Overal bed mobility: Needs Assistance ?Bed Mobility: Supine to Sit, Sit to Supine ?  ?  ?Supine to sit: Supervision ?Sit to supine: Supervision ?  ?General bed mobility comments: Light min A for R LE ?  ? ?Transfers ?Overall transfer level: Needs assistance ?Equipment used: Rolling walker (2 wheels) ?Transfers: Sit to/from Stand ?Sit to Stand: Min guard ?  ?  ?  ?  ?  ?General transfer comment: min guard for safety; cues for hand placement and R LE management ?  ? ?Ambulation/Gait ?Ambulation/Gait assistance: Min guard, Supervision ?Gait Distance (Feet): 100 Feet ?Assistive device: Rolling walker (2 wheels) ?Gait Pattern/deviations: Step-through pattern, Decreased stride length ?Gait velocity: decreased ?  ?  ?General Gait Details: min guard progressing to supervision; steady gait with min cues for RW proximity ? ?Stairs ?Stairs: Yes ?Stairs assistance: Min guard ?  ?Number of Stairs: 8 ?General stair comments: Started with bil rails forward, tried 1 rail on L forward, then 1 rail on R sideways.  Pt preferred sideways ? ?Wheelchair Mobility ?  ? ?Modified Rankin (Stroke Patients Only) ?  ? ?  ? ?Balance Overall balance assessment: Needs assistance ?Sitting-balance support: No upper extremity supported ?Sitting balance-Leahy Scale: Normal ?  ?  ?Standing balance support: Bilateral upper extremity supported, No upper extremity supported ?Standing balance-Leahy Scale: Fair ?Standing balance comment: RW to ambulate but could static stand no AD ?  ?  ?  ?  ?  ?  ?  ?  ?  ?  ?  ?   ? ? ? ?Pertinent Vitals/Pain Pain Assessment ?Pain Assessment: No/denies pain  ? ? ?Home Living   ?  ?  ?  ?  ?  ?  ?  ?  ?  ?   ?  ?  Prior Function   ?  ?  ?  ?  ?  ?  ?  ?  ?  ? ? ?Hand Dominance  ?   ? ?  ?Extremity/Trunk Assessment  ?  ?  ? ? ?  ? ?  ?Communication  ?    ?Cognition Arousal/Alertness: Awake/alert ?Behavior During Therapy: [facility] for tasks assessed/performed ?Overall Cognitive Status: Within  Functional Limits for tasks assessed ?  ?  ?  ?  ?  ?  ?  ?  ?  ?  ?  ?  ?  ?  ?  ?  ?  ?  ?  ? ?  ?General Comments General comments (skin integrity, edema, etc.): Educated on HEP ? ?Educated on safe ice use, no pivots, car transfers, resting with leg straight, and TED hose during day. Also, encouraged walking every 1-2 hours during day. Educated on HEP with focus on mobility the first weeks. Discussed doing exercises within pain control and if pain increasing could decreased ROM, reps, and stop exercises as needed. Encouraged to perform quad sets and ankle pumps frequently for blood flow and to promote full knee extension. ?  ?Exercises   ? ?Assessment/Plan  ?  ?PT Assessment Patient needs continued PT services  ?PT Problem List Decreased strength;Decreased mobility;Decreased range of motion;Decreased coordination;Decreased activity tolerance;Cardiopulmonary status limiting activity;Decreased balance;Decreased knowledge of use of DME ? ?   ?  ?PT Treatment Interventions DME instruction;Therapeutic activities;Modalities;Therapeutic exercise;Patient/family education;Gait training;Stair training;Balance training;Functional mobility training   ? ?PT Goals (Current goals can be found in the Care Plan section)  ?Acute Rehab PT Goals ?Patient Stated Goal: return home ?PT Goal Formulation: With patient ?Time For Goal Achievement: 06/26/21 ?Potential to Achieve Goals: Good ? ?  ?Frequency 7X/week ?  ? ? ?Co-evaluation   ?  ?  ?  ?  ? ? ?  ?AM-PAC PT "6 Clicks" Mobility  ?Outcome Measure Help needed turning from your back to your side while in a flat bed without using bedrails?: None ?Help needed moving from lying on your back to sitting on the side of a flat bed without using bedrails?: None ?Help needed moving to and from a bed to a chair (including a wheelchair)?: A Little ?Help needed standing up from a chair using your arms (e.g., wheelchair or bedside chair)?: A Little ?Help needed to walk in hospital room?: A  Little ?Help needed climbing 3-5 steps with a railing? : A Little ?6 Click Score: 20 ? ?  ?End of Session Equipment Utilized During Treatment: Gait belt ?Activity Tolerance: Patient tolerated treatment well ?Patient left: in bed;with call bell/phone within reach ?Nurse Communication: Mobility status ?PT Visit Diagnosis: Other abnormalities of gait and mobility (R26.89);Muscle weakness (generalized) (M62.81) ?  ? ?Time: 1650-1710 ?PT Time Calculation (min) (ACUTE ONLY): 20 min ? ? ?Charges:    ?PT Treatments ?$Gait Training: 8-22 mins ? ?  ?   ? ? ?Abran Richard, PT ?Acute Rehab Services ?Pager [phone] ?[facility] [phone] ? ? ?Corey Bruce ?06/12/2021, 5:15 PM ?

## 2021-06-12 NOTE — Anesthesia Procedure Notes (Signed)
Anesthesia Regional Block: Adductor canal block  ? ?Pre-Anesthetic Checklist: , timeout performed,  Correct Patient, Correct Site, Correct Laterality,  Correct Procedure, Correct Position, site marked,  Risks and benefits discussed,  Surgical consent,  Pre-op evaluation,  At surgeon's request and post-op pain management ? ?Laterality: Right and Lower ? ?Prep: chloraprep     ?  ?Needles:  ?Injection technique: Single-shot ? ?Needle Type: Echogenic Needle   ? ? ?Needle Length: 9cm  ?Needle Gauge: 21  ? ? ? ?Additional Needles: ? ? ?Procedures:,,,, ultrasound used (permanent image in chart),,    ?Narrative:  ?Start time: 06/12/2021 7:52 AM ?End time: 06/12/2021 7:58 AM ?Injection made incrementally with aspirations every 5 mL. ? ?Performed by: Personally  ?Anesthesiologist: Annye Asa, MD ? ?Additional Notes: ?Pt identified in Holding room.  Monitors applied. Working IV access confirmed. Sterile prep R thigh.  #21ga ECHOgenic Arrow block needle into adductor canal with US guidance.  20cc 0.75% Ropivacaine injected incrementally after negative test dose.  Patient asymptomatic, VSS, no heme aspirated, tolerated well.   Jenita Seashore, MD ? ? ? ? ?

## 2021-06-14 ENCOUNTER — Encounter (HOSPITAL_COMMUNITY): Payer: Self-pay | Admitting: Orthopedic Surgery

## 2021-06-14 DIAGNOSIS — M25661 Stiffness of right knee, not elsewhere classified: Secondary | ICD-10-CM | POA: Diagnosis not present

## 2021-06-14 DIAGNOSIS — R262 Difficulty in walking, not elsewhere classified: Secondary | ICD-10-CM | POA: Diagnosis not present

## 2021-06-14 DIAGNOSIS — M6281 Muscle weakness (generalized): Secondary | ICD-10-CM | POA: Diagnosis not present

## 2021-06-14 DIAGNOSIS — M1711 Unilateral primary osteoarthritis, right knee: Secondary | ICD-10-CM | POA: Diagnosis not present

## 2021-06-14 DIAGNOSIS — Z96651 Presence of right artificial knee joint: Secondary | ICD-10-CM | POA: Diagnosis not present

## 2021-06-17 DIAGNOSIS — M1711 Unilateral primary osteoarthritis, right knee: Secondary | ICD-10-CM | POA: Diagnosis not present

## 2021-06-17 DIAGNOSIS — M6281 Muscle weakness (generalized): Secondary | ICD-10-CM | POA: Diagnosis not present

## 2021-06-17 DIAGNOSIS — Z96651 Presence of right artificial knee joint: Secondary | ICD-10-CM | POA: Diagnosis not present

## 2021-06-17 DIAGNOSIS — M25661 Stiffness of right knee, not elsewhere classified: Secondary | ICD-10-CM | POA: Diagnosis not present

## 2021-06-17 DIAGNOSIS — R262 Difficulty in walking, not elsewhere classified: Secondary | ICD-10-CM | POA: Diagnosis not present

## 2021-06-20 DIAGNOSIS — Z96651 Presence of right artificial knee joint: Secondary | ICD-10-CM | POA: Diagnosis not present

## 2021-06-20 DIAGNOSIS — M25661 Stiffness of right knee, not elsewhere classified: Secondary | ICD-10-CM | POA: Diagnosis not present

## 2021-06-20 DIAGNOSIS — M1711 Unilateral primary osteoarthritis, right knee: Secondary | ICD-10-CM | POA: Diagnosis not present

## 2021-06-20 DIAGNOSIS — M6281 Muscle weakness (generalized): Secondary | ICD-10-CM | POA: Diagnosis not present

## 2021-06-20 DIAGNOSIS — R262 Difficulty in walking, not elsewhere classified: Secondary | ICD-10-CM | POA: Diagnosis not present

## 2021-06-25 DIAGNOSIS — M25661 Stiffness of right knee, not elsewhere classified: Secondary | ICD-10-CM | POA: Diagnosis not present

## 2021-06-25 DIAGNOSIS — Z96651 Presence of right artificial knee joint: Secondary | ICD-10-CM | POA: Diagnosis not present

## 2021-06-25 DIAGNOSIS — R262 Difficulty in walking, not elsewhere classified: Secondary | ICD-10-CM | POA: Diagnosis not present

## 2021-06-25 DIAGNOSIS — M6281 Muscle weakness (generalized): Secondary | ICD-10-CM | POA: Diagnosis not present

## 2021-06-25 DIAGNOSIS — M1711 Unilateral primary osteoarthritis, right knee: Secondary | ICD-10-CM | POA: Diagnosis not present

## 2021-06-27 DIAGNOSIS — Z96651 Presence of right artificial knee joint: Secondary | ICD-10-CM | POA: Diagnosis not present

## 2021-06-27 DIAGNOSIS — R262 Difficulty in walking, not elsewhere classified: Secondary | ICD-10-CM | POA: Diagnosis not present

## 2021-06-27 DIAGNOSIS — M6281 Muscle weakness (generalized): Secondary | ICD-10-CM | POA: Diagnosis not present

## 2021-06-27 DIAGNOSIS — M1711 Unilateral primary osteoarthritis, right knee: Secondary | ICD-10-CM | POA: Diagnosis not present

## 2021-06-27 DIAGNOSIS — M25661 Stiffness of right knee, not elsewhere classified: Secondary | ICD-10-CM | POA: Diagnosis not present

## 2021-07-01 DIAGNOSIS — M1711 Unilateral primary osteoarthritis, right knee: Secondary | ICD-10-CM | POA: Diagnosis not present

## 2021-07-02 DIAGNOSIS — M25661 Stiffness of right knee, not elsewhere classified: Secondary | ICD-10-CM | POA: Diagnosis not present

## 2021-07-02 DIAGNOSIS — M1711 Unilateral primary osteoarthritis, right knee: Secondary | ICD-10-CM | POA: Diagnosis not present

## 2021-07-02 DIAGNOSIS — Z96651 Presence of right artificial knee joint: Secondary | ICD-10-CM | POA: Diagnosis not present

## 2021-07-02 DIAGNOSIS — R262 Difficulty in walking, not elsewhere classified: Secondary | ICD-10-CM | POA: Diagnosis not present

## 2021-07-02 DIAGNOSIS — M6281 Muscle weakness (generalized): Secondary | ICD-10-CM | POA: Diagnosis not present

## 2021-07-05 DIAGNOSIS — M25661 Stiffness of right knee, not elsewhere classified: Secondary | ICD-10-CM | POA: Diagnosis not present

## 2021-07-05 DIAGNOSIS — M6281 Muscle weakness (generalized): Secondary | ICD-10-CM | POA: Diagnosis not present

## 2021-07-05 DIAGNOSIS — M1711 Unilateral primary osteoarthritis, right knee: Secondary | ICD-10-CM | POA: Diagnosis not present

## 2021-07-05 DIAGNOSIS — R262 Difficulty in walking, not elsewhere classified: Secondary | ICD-10-CM | POA: Diagnosis not present

## 2021-07-05 DIAGNOSIS — Z96651 Presence of right artificial knee joint: Secondary | ICD-10-CM | POA: Diagnosis not present

## 2021-07-08 DIAGNOSIS — R262 Difficulty in walking, not elsewhere classified: Secondary | ICD-10-CM | POA: Diagnosis not present

## 2021-07-08 DIAGNOSIS — Z96651 Presence of right artificial knee joint: Secondary | ICD-10-CM | POA: Diagnosis not present

## 2021-07-08 DIAGNOSIS — M1711 Unilateral primary osteoarthritis, right knee: Secondary | ICD-10-CM | POA: Diagnosis not present

## 2021-07-08 DIAGNOSIS — M25661 Stiffness of right knee, not elsewhere classified: Secondary | ICD-10-CM | POA: Diagnosis not present

## 2021-07-08 DIAGNOSIS — M6281 Muscle weakness (generalized): Secondary | ICD-10-CM | POA: Diagnosis not present

## 2021-07-09 DIAGNOSIS — M1711 Unilateral primary osteoarthritis, right knee: Secondary | ICD-10-CM | POA: Diagnosis not present

## 2021-07-11 DIAGNOSIS — R262 Difficulty in walking, not elsewhere classified: Secondary | ICD-10-CM | POA: Diagnosis not present

## 2021-07-11 DIAGNOSIS — M6281 Muscle weakness (generalized): Secondary | ICD-10-CM | POA: Diagnosis not present

## 2021-07-11 DIAGNOSIS — Z96651 Presence of right artificial knee joint: Secondary | ICD-10-CM | POA: Diagnosis not present

## 2021-07-11 DIAGNOSIS — M1711 Unilateral primary osteoarthritis, right knee: Secondary | ICD-10-CM | POA: Diagnosis not present

## 2021-07-11 DIAGNOSIS — M25661 Stiffness of right knee, not elsewhere classified: Secondary | ICD-10-CM | POA: Diagnosis not present

## 2021-07-16 DIAGNOSIS — Z96651 Presence of right artificial knee joint: Secondary | ICD-10-CM | POA: Diagnosis not present

## 2021-07-16 DIAGNOSIS — R262 Difficulty in walking, not elsewhere classified: Secondary | ICD-10-CM | POA: Diagnosis not present

## 2021-07-16 DIAGNOSIS — M1711 Unilateral primary osteoarthritis, right knee: Secondary | ICD-10-CM | POA: Diagnosis not present

## 2021-07-16 DIAGNOSIS — M6281 Muscle weakness (generalized): Secondary | ICD-10-CM | POA: Diagnosis not present

## 2021-07-16 DIAGNOSIS — M25661 Stiffness of right knee, not elsewhere classified: Secondary | ICD-10-CM | POA: Diagnosis not present

## 2021-07-23 DIAGNOSIS — Z96651 Presence of right artificial knee joint: Secondary | ICD-10-CM | POA: Diagnosis not present

## 2021-07-23 DIAGNOSIS — M6281 Muscle weakness (generalized): Secondary | ICD-10-CM | POA: Diagnosis not present

## 2021-07-23 DIAGNOSIS — M25661 Stiffness of right knee, not elsewhere classified: Secondary | ICD-10-CM | POA: Diagnosis not present

## 2021-07-23 DIAGNOSIS — R262 Difficulty in walking, not elsewhere classified: Secondary | ICD-10-CM | POA: Diagnosis not present

## 2021-07-23 DIAGNOSIS — M1711 Unilateral primary osteoarthritis, right knee: Secondary | ICD-10-CM | POA: Diagnosis not present

## 2021-07-30 DIAGNOSIS — M1711 Unilateral primary osteoarthritis, right knee: Secondary | ICD-10-CM | POA: Diagnosis not present

## 2021-10-01 DIAGNOSIS — M1711 Unilateral primary osteoarthritis, right knee: Secondary | ICD-10-CM | POA: Diagnosis not present

## 2021-12-11 DIAGNOSIS — E782 Mixed hyperlipidemia: Secondary | ICD-10-CM | POA: Diagnosis not present

## 2021-12-16 DIAGNOSIS — Z8249 Family history of ischemic heart disease and other diseases of the circulatory system: Secondary | ICD-10-CM | POA: Diagnosis not present

## 2021-12-16 DIAGNOSIS — E782 Mixed hyperlipidemia: Secondary | ICD-10-CM | POA: Diagnosis not present

## 2021-12-16 DIAGNOSIS — B351 Tinea unguium: Secondary | ICD-10-CM | POA: Diagnosis not present

## 2021-12-16 DIAGNOSIS — Z23 Encounter for immunization: Secondary | ICD-10-CM | POA: Diagnosis not present

## 2021-12-16 DIAGNOSIS — R7303 Prediabetes: Secondary | ICD-10-CM | POA: Diagnosis not present

## 2021-12-16 DIAGNOSIS — K420 Umbilical hernia with obstruction, without gangrene: Secondary | ICD-10-CM | POA: Diagnosis not present

## 2021-12-16 DIAGNOSIS — I2510 Atherosclerotic heart disease of native coronary artery without angina pectoris: Secondary | ICD-10-CM | POA: Diagnosis not present

## 2021-12-16 DIAGNOSIS — I10 Essential (primary) hypertension: Secondary | ICD-10-CM | POA: Diagnosis not present

## 2021-12-16 DIAGNOSIS — Z0000 Encounter for general adult medical examination without abnormal findings: Secondary | ICD-10-CM | POA: Diagnosis not present

## 2021-12-16 DIAGNOSIS — M179 Osteoarthritis of knee, unspecified: Secondary | ICD-10-CM | POA: Diagnosis not present

## 2021-12-24 ENCOUNTER — Ambulatory Visit (INDEPENDENT_AMBULATORY_CARE_PROVIDER_SITE_OTHER): Payer: Medicare Other | Admitting: Podiatry

## 2021-12-24 DIAGNOSIS — L989 Disorder of the skin and subcutaneous tissue, unspecified: Secondary | ICD-10-CM

## 2021-12-24 NOTE — Progress Notes (Signed)
Chief Complaint  Patient presents with   Callouses    Patient is here for right foot great toe and 2nd toe pain patient states that it hurts he used to get shaved down.    Subjective: 71 y.o. male presenting to the office today for evaluation of symptomatic calluses between the great toe and second toe of the right foot.  Patient states that he comes into the office occasionally to have the symptomatic calluses between the toes debrided.  He says he only gets about 7 weeks of relief.  He is leaving for a trip and traveling for the next 15 months with his wife and would like to have them evaluated and treated today  Past Medical History:  Diagnosis Date   CAD (coronary artery disease)    Nonobstructive at cardiac catheterization 01/2020   DJD (degenerative joint disease) of knee    Left   Essential hypertension    Hyperlipidemia    OSA (obstructive sleep apnea)    no cpap   Osteoarthritis     Past Surgical History:  Procedure Laterality Date   BACK SURGERY     lower   COLONOSCOPY  02/09/2012   Procedure: COLONOSCOPY;  Surgeon: Daneil Dolin, MD;  [facility];  Service: Endoscopy;  Laterality: N/A;  9:30 had to reschd due to ercp case called patient advised of new time 12:30 for 1:30 case patient stated okay/kr    HEMORRHOID SURGERY     LEFT HEART CATH AND CORONARY ANGIOGRAPHY N/A 01/06/2020   Procedure: LEFT HEART CATH AND CORONARY ANGIOGRAPHY;  Surgeon: Troy Sine, MD;  Location: [facility];  Service: Cardiovascular;  Laterality: N/A;   PARTIAL KNEE ARTHROPLASTY Left 12/03/2015   Procedure: LEFT UNICOMPARTMENTAL KNEE;  Surgeon: Elsie Saas, MD;  Location: [facility];  Service: Orthopedics;  Laterality: Left;   PARTIAL KNEE ARTHROPLASTY Right 06/12/2021   Procedure: UNICOMPARTMENTAL KNEE;  Surgeon: Willaim Sheng, MD;  Location: [facility];  Service: Orthopedics;  Laterality: Right;    Allergies  Allergen Reactions   Erythromycin  Nausea Only    ORALLY  CAUSED NAUSEA/ IV BURNED VEIN     Objective:  Physical Exam General: Alert and oriented x3 in no acute distress  Dermatology: Hyperkeratotic lesion(s) present on the lateral aspect of the right great toe as well as the medial aspect of the adjacent second digit likely secondary to friction and pressure between the toes. Pain on palpation with a central nucleated core noted. Skin is warm, dry and supple bilateral lower extremities. Negative for open lesions or macerations.  Vascular: Palpable pedal pulses bilaterally. No edema or erythema noted. Capillary refill within normal limits.  Neurological: Epicritic and protective threshold grossly intact bilaterally.   Musculoskeletal Exam: No pedal deformity noted  Assessment: 1.  Symptomatic callus; benign skin lesion first interdigital webspace right foot   Plan of Care:  1. Patient evaluated 2. Excisional debridement of keratoic lesion(s) using a chisel blade was performed without incident.  Salicylic acid applied.  Recommend OTC corn callus remover as needed 3.  Silicone toe spacers were dispensed to wear daily to alleviate pressure between the great toe and the second toe 4. Patient is to return to the clinic PRN.   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  Leaving for a 21-monthroad trip with him and his wife  BEdrick Kins DPM [facility]  Dr. BEdrick Kins DPM    [address], [place] [address]                Office [phone]  Fax [phone]

## 2021-12-27 ENCOUNTER — Other Ambulatory Visit: Payer: Self-pay | Admitting: Podiatry

## 2021-12-27 MED ORDER — CEPHALEXIN 500 MG PO CAPS: 500.0000 mg | ORAL_CAPSULE | Freq: Three times a day (TID) | ORAL | 0 refills | Status: DC

## 2021-12-27 NOTE — Progress Notes (Signed)
Spoke with patient via telephone.  Concern for redness between the great toe and the second toe apparently.  He had a hard time explaining the redness but was concern for infection.  As a precaution I will go ahead and send in Keflex 500 mg 3 times daily #21.  Recommend follow-up in office next week.  Patient understands and agrees.  Advised the patient to go immediately to the emergency department for worsening redness or concern for spreading infection.  Edrick Kins, DPM [facility]  Dr. Edrick Kins, DPM    [address], [place] [address]                Office [phone]  Fax [phone]

## 2022-01-07 ENCOUNTER — Ambulatory Visit: Payer: Medicare Other | Admitting: Podiatry

## 2022-01-14 ENCOUNTER — Ambulatory Visit (INDEPENDENT_AMBULATORY_CARE_PROVIDER_SITE_OTHER): Payer: Medicare Other | Admitting: Podiatry

## 2022-01-14 ENCOUNTER — Ambulatory Visit (INDEPENDENT_AMBULATORY_CARE_PROVIDER_SITE_OTHER): Payer: Medicare Other

## 2022-01-14 DIAGNOSIS — L989 Disorder of the skin and subcutaneous tissue, unspecified: Secondary | ICD-10-CM

## 2022-01-14 NOTE — Progress Notes (Signed)
Chief Complaint  Patient presents with   Toe Pain    Patient is here for right great toe an second toe, the patient states that the toes hurt worse.    Subjective: 71 y.o. male presenting to the office today for follow-up evaluation of symptomatic calluses between the great toe and second toe of the right foot.  Patient states that he comes into the office occasionally to have the symptomatic calluses between the toes debrided.  He says he only gets about 7 weeks of relief.  He is leaving for a trip and traveling for the next 15 months with his wife and would like to have them evaluated and treated today  Past Medical History:  Diagnosis Date   CAD (coronary artery disease)    Nonobstructive at cardiac catheterization 01/2020   DJD (degenerative joint disease) of knee    Left   Essential hypertension    Hyperlipidemia    OSA (obstructive sleep apnea)    no cpap   Osteoarthritis     Past Surgical History:  Procedure Laterality Date   BACK SURGERY     lower   COLONOSCOPY  02/09/2012   Procedure: COLONOSCOPY;  Surgeon: Daneil Dolin, MD;  [facility];  Service: Endoscopy;  Laterality: N/A;  9:30 had to reschd due to ercp case called patient advised of new time 12:30 for 1:30 case patient stated okay/kr    HEMORRHOID SURGERY     LEFT HEART CATH AND CORONARY ANGIOGRAPHY N/A 01/06/2020   Procedure: LEFT HEART CATH AND CORONARY ANGIOGRAPHY;  Surgeon: Troy Sine, MD;  Location: [facility];  Service: Cardiovascular;  Laterality: N/A;   PARTIAL KNEE ARTHROPLASTY Left 12/03/2015   Procedure: LEFT UNICOMPARTMENTAL KNEE;  Surgeon: Elsie Saas, MD;  Location: [facility];  Service: Orthopedics;  Laterality: Left;   PARTIAL KNEE ARTHROPLASTY Right 06/12/2021   Procedure: UNICOMPARTMENTAL KNEE;  Surgeon: Willaim Sheng, MD;  [facility];  Service: Orthopedics;  Laterality: Right;    Allergies  Allergen Reactions   Erythromycin Nausea Only     ORALLY  CAUSED NAUSEA/ IV BURNED VEIN     Objective:  Physical Exam General: Alert and oriented x3 in no acute distress  Dermatology: Hyperkeratotic lesion(s) present on the lateral aspect of the right great toe as well as the medial aspect of the adjacent second digit likely secondary to friction and pressure between the toes. Pain on palpation with a central nucleated core noted. Skin is warm, dry and supple bilateral lower extremities. Negative for open lesions or macerations.  Vascular: Palpable pedal pulses bilaterally. No edema or erythema noted. Capillary refill within normal limits.  Neurological: Epicritic and protective threshold grossly intact bilaterally.   Musculoskeletal Exam: No pedal deformity noted  Radiographic exam RT foot 01/14/2022: Normal osseous mineralization.  There is some degenerative changes noted to the MTP and PIPJ of the great toe joint.  Skin marker placed between the great toe and second toe at the area of the symptomatic callus formation.  No prominent spurring noted.  Assessment: 1.  Symptomatic callus; benign skin lesion first interdigital webspace right foot   Plan of Care:  1. Patient evaluated 2. Light Excisional debridement of keratoic lesion(s) using a chisel blade was performed without incident.  Salicylic acid applied.  Recommend OTC corn callus remover as needed 3.  Continue silicone toe spacers PRN 4.  Recommend wide fitting shoes that do not constrict the toebox area  5.  Patient is  to return to the clinic PRN.   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  Leaving for a 43-monthroad trip with him and his wife  BEdrick Kins DPM [facility]  Dr. BEdrick Kins DPM    [address] [place] [address]               Office [phone] Fax [phone]

## 2022-04-10 ENCOUNTER — Ambulatory Visit: Payer: Medicare Other | Admitting: Cardiology

## 2022-10-28 NOTE — Progress Notes (Unsigned)
    Cardiology Office Note  Date: 10/29/2022   ID: Corey Bruce, DOB 04-07-1950, MRN [id number]  History of Present Illness: Corey Bruce is a 72 y.o. male last seen in June 2022.  He is here for a follow-up visit.  States that he has done well without any exertional angina or change in stamina, NYHA class I dyspnea, no palpitations or syncope.  He remains active, in fact spent 7 months on the road traveling in an RV over this past year.  Also plans to move to [place] when his family relocates later this year.  He continues to follow with Dr. Margo Aye, I reviewed lab work from August.  We went over his medications and discussed uptitrating Lipitor to 20 mg daily to get LDL closer to goal.  Blood pressure looks good today.  ECG today shows sinus bradycardia.  Physical Exam: VS:  BP 124/72 (BP Location: Left Arm, Patient Position: Sitting, Cuff Size: Normal)   Pulse (!) 55   Ht 6\' 2"  (1.88 m)   Wt 218 lb (98.9 kg)   SpO2 96%   BMI 27.99 kg/m , BMI Body mass index is 27.99 kg/m.  Wt Readings from Last 3 Encounters:  10/29/22 218 lb (98.9 kg)  06/12/21 222 lb (100.7 kg)  06/03/21 222 lb (100.7 kg)    General: Patient appears comfortable at rest. HEENT: Conjunctiva and lids normal. Neck: Supple, no elevated JVP or carotid bruits. Lungs: Clear to auscultation, nonlabored breathing at rest. Cardiac: Regular rate and rhythm, no S3 or significant systolic murmur. Abdomen: Soft, nontender, bowel sounds present. Extremities: No pitting edema.  ECG:  An ECG dated 06/03/2021 was personally reviewed today and demonstrated:  Sinus bradycardia.  Labwork:  August 2024: Hemoglobin 15.4, platelets 182, BUN 16, creatinine 0.87, potassium 4.8, AST 22, ALT 20, cholesterol 141, triglycerides 82, HDL 39, LDL 86, hemoglobin A1c 5.5%  Other Studies Reviewed Today:  No interval cardiac testing for review today.  Assessment and Plan:  1.  CAD, nonobstructive by cardiac catheterization in  November 2021.  He remains asymptomatic on medical therapy, ECG reviewed.  Continue aspirin and Lipitor.  2.  Essential hypertension.  He remains on Benicar with good blood pressure control noted today.  3.  Mixed hyperlipidemia.  Recent LDL 86.  Discussed increasing Lipitor to 20 mg daily for now, ideally goal LDL 55 or less.  No intolerances.  Disposition:  Follow up prn  Signed, Jonelle Sidle, M.D., F.A.C.C. [facility]

## 2022-10-29 ENCOUNTER — Ambulatory Visit: Payer: Medicare Other | Attending: Cardiology | Admitting: Cardiology

## 2022-10-29 ENCOUNTER — Encounter: Payer: Self-pay | Admitting: Cardiology

## 2022-10-29 VITALS — BP 124/72 | HR 55 | Ht 74.0 in | Wt 218.0 lb

## 2022-10-29 DIAGNOSIS — I2510 Atherosclerotic heart disease of native coronary artery without angina pectoris: Secondary | ICD-10-CM

## 2022-10-29 DIAGNOSIS — E782 Mixed hyperlipidemia: Secondary | ICD-10-CM

## 2022-10-29 DIAGNOSIS — I10 Essential (primary) hypertension: Secondary | ICD-10-CM | POA: Diagnosis not present

## 2022-10-29 MED ORDER — ATORVASTATIN CALCIUM 20 MG PO TABS: 20.0000 mg | ORAL_TABLET | Freq: Every day | ORAL | 3 refills | Status: AC

## 2022-10-29 NOTE — Patient Instructions (Signed)
Medication Instructions:  INCREASE Atorvastatin to 20 mg daily   Labwork: None today  Testing/Procedures: none  Follow-Up: N/A  Any Other Special Instructions Will Be Listed Below (If Applicable).  If you need a refill on your cardiac medications before your next appointment, please call your pharmacy.

## 2022-11-19 ENCOUNTER — Telehealth: Payer: Self-pay | Admitting: Cardiology

## 2022-11-19 NOTE — Telephone Encounter (Signed)
Pre-operative Risk Assessment    Patient Name: Corey Bruce  DOB: Sep 29, 1950 MRN: [id number]      Request for Surgical Clearance    Procedure:   Colonoscopy   Date of Surgery:  Clearance 12/09/2022                                Surgeon:  Dr Iva Lento Group or Practice Name:  [facility]  Phone number:  [phone] Fax number:  [phone]   Type of Clearance Requested:   Medical    Type of Anesthesia:   propofol    Additional requests/questions:    Lajuana Matte   11/19/2022, 12:00 PM

## 2022-11-19 NOTE — Telephone Encounter (Signed)
Patient Name: Corey Bruce  DOB: 09/18/1950 MRN: [id number]  Primary Cardiologist: Nona Dell, MD  Chart reviewed as part of pre-operative protocol coverage. Pre-op clearance already addressed by colleagues in earlier phone notes. To summarize recommendations:  -Yes, colonoscopy risk is low overall and his cardiovascular status has been stable of late.  Follow-up was made as needed because he is moving to [place] later this year.  -Dr. Diona Browner   No further testing indicated at this time.  Should continue ASA throughout the procedure.  Will route this bundled recommendation to requesting provider via Epic fax function and remove from pre-op pool. Please call with questions.  Sharlene Dory, PA-C 11/19/2022, 12:42 PM

## 2023-01-14 IMAGING — DX DG KNEE 1-2V PORT*R*
2 series · 2 of 2 positions shown · non-contrast
Comparison: None.

CLINICAL DATA: Postop surgery right knee

EXAM:
PORTABLE RIGHT KNEE - 1-2 VIEW

[knee ap]
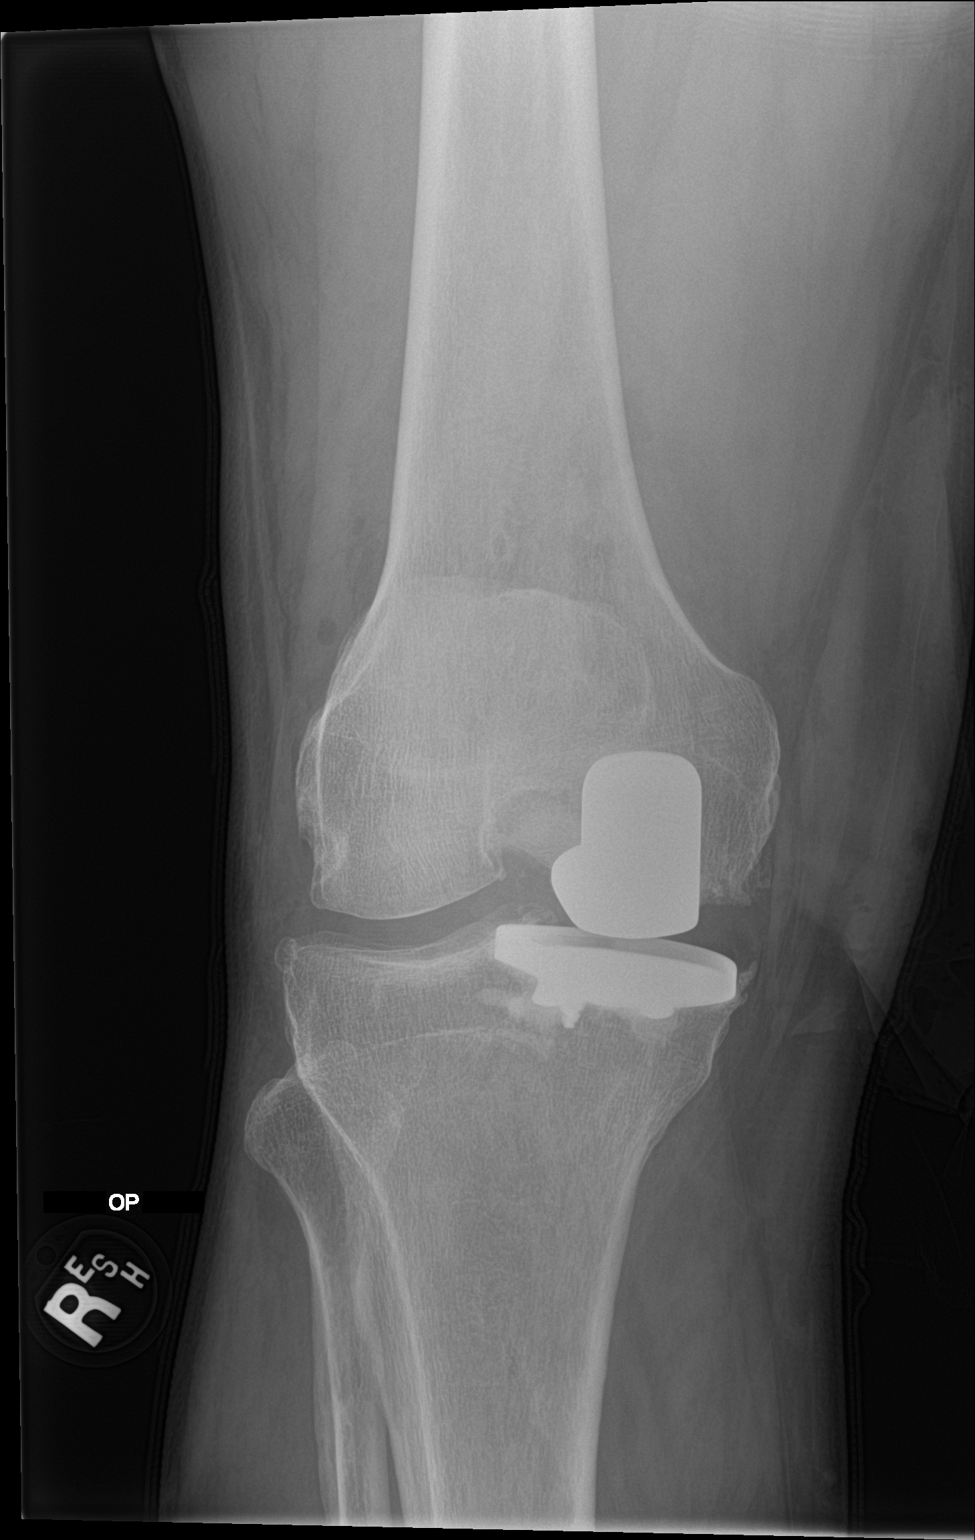

[knee lat]
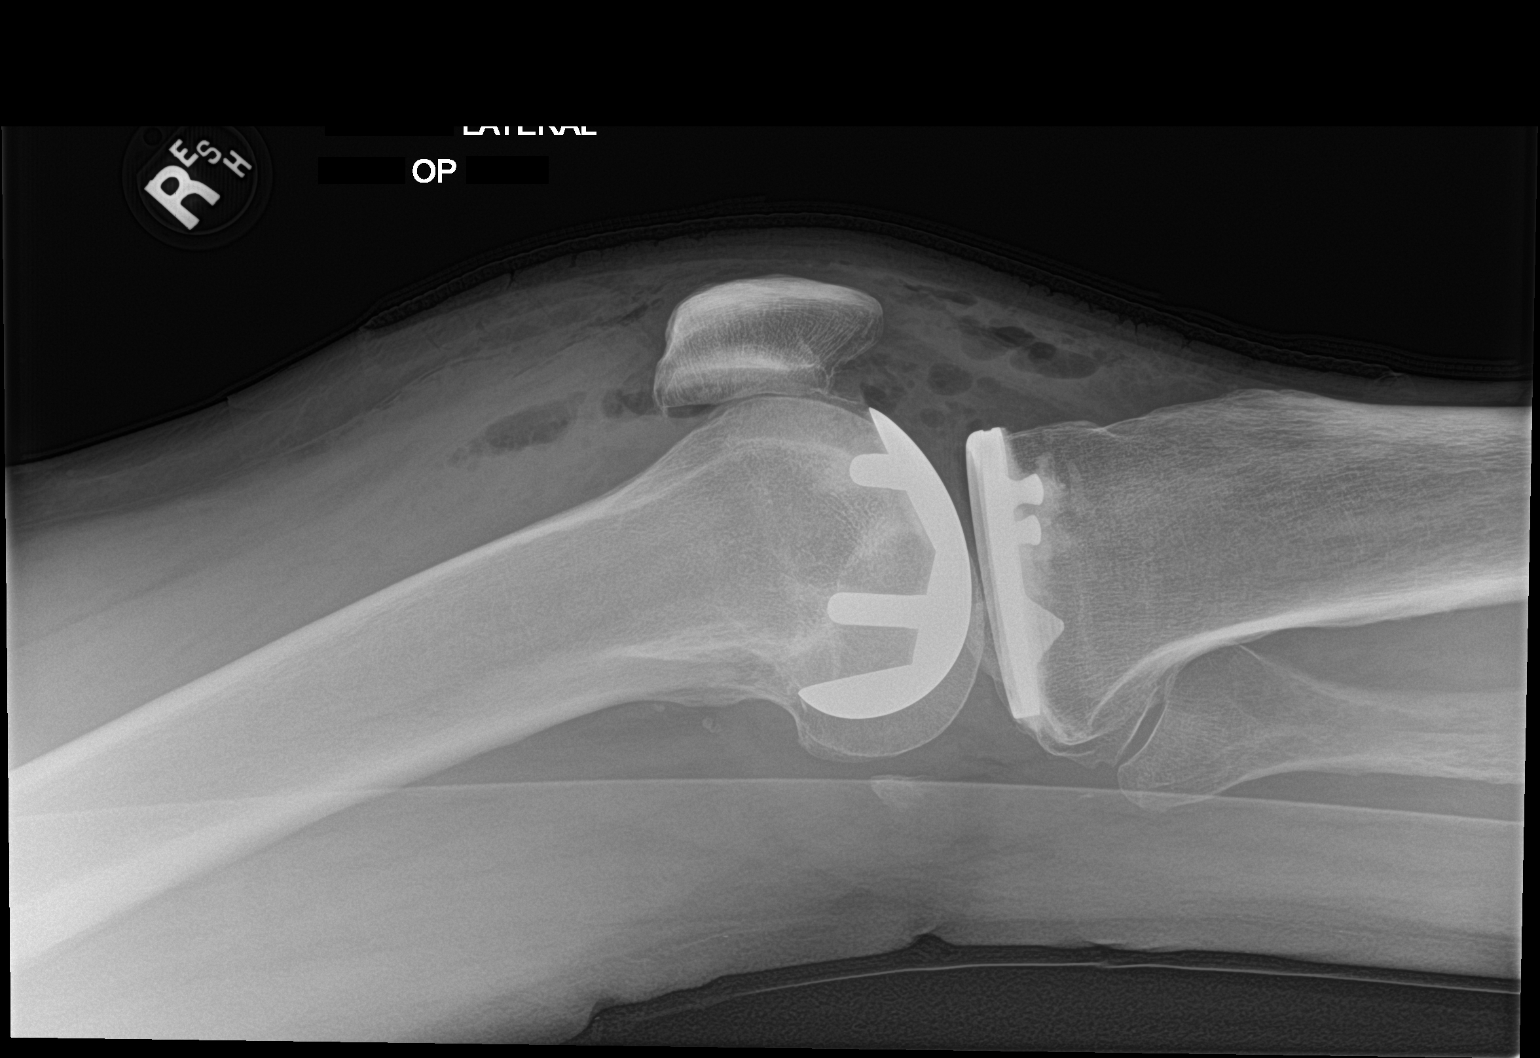

[2 of 2 positions shown; findings below may reference images not displayed]

FINDINGS: Postop hemiarthroplasty involving the medial compartment of the
knee. Prosthesis in satisfactory position. No fracture or
complication. Gas and edema in the soft tissues and knee joint.
IMPRESSION: Satisfactory hemiarthroplasty right knee.
